# Patient Record
Sex: Female | Born: 1970 | Race: Black or African American | Hispanic: No | Smoking: Former smoker
Health system: Southern US, Community
[De-identification: ages and names within clinical notes are randomized; demographics above are authoritative.]

## PROBLEM LIST (undated history)

## (undated) DIAGNOSIS — N189 Chronic kidney disease, unspecified: Secondary | ICD-10-CM

## (undated) DIAGNOSIS — M797 Fibromyalgia: Secondary | ICD-10-CM

## (undated) DIAGNOSIS — E611 Iron deficiency: Secondary | ICD-10-CM

## (undated) DIAGNOSIS — R7303 Prediabetes: Secondary | ICD-10-CM

## (undated) DIAGNOSIS — D649 Anemia, unspecified: Secondary | ICD-10-CM

## (undated) DIAGNOSIS — K219 Gastro-esophageal reflux disease without esophagitis: Secondary | ICD-10-CM

## (undated) DIAGNOSIS — J45909 Unspecified asthma, uncomplicated: Secondary | ICD-10-CM

## (undated) DIAGNOSIS — M719 Bursopathy, unspecified: Secondary | ICD-10-CM

## (undated) DIAGNOSIS — Z8249 Family history of ischemic heart disease and other diseases of the circulatory system: Secondary | ICD-10-CM

## (undated) DIAGNOSIS — R9439 Abnormal result of other cardiovascular function study: Secondary | ICD-10-CM

## (undated) DIAGNOSIS — F32A Depression, unspecified: Secondary | ICD-10-CM

## (undated) DIAGNOSIS — F329 Major depressive disorder, single episode, unspecified: Secondary | ICD-10-CM

## (undated) HISTORY — PX: KIDNEY SURGERY: SHX687

## (undated) HISTORY — PX: ESOPHAGOGASTRODUODENOSCOPY: SHX1529

## (undated) HISTORY — PX: TONSILLECTOMY: SUR1361

---

## 1995-05-31 HISTORY — PX: TUBAL LIGATION: SHX77

## 1998-11-19 ENCOUNTER — Emergency Department (HOSPITAL_COMMUNITY): Admission: EM | Admit: 1998-11-19 | Discharge: 1998-11-19 | Payer: Self-pay | Admitting: Emergency Medicine

## 1999-01-09 ENCOUNTER — Emergency Department (HOSPITAL_COMMUNITY): Admission: EM | Admit: 1999-01-09 | Discharge: 1999-01-09 | Payer: Self-pay | Admitting: *Deleted

## 1999-03-17 ENCOUNTER — Encounter: Payer: Self-pay | Admitting: Emergency Medicine

## 1999-03-17 ENCOUNTER — Emergency Department (HOSPITAL_COMMUNITY): Admission: EM | Admit: 1999-03-17 | Discharge: 1999-03-17 | Payer: Self-pay | Admitting: Emergency Medicine

## 1999-04-19 ENCOUNTER — Emergency Department (HOSPITAL_COMMUNITY): Admission: EM | Admit: 1999-04-19 | Discharge: 1999-04-19 | Payer: Self-pay | Admitting: Emergency Medicine

## 2000-12-13 ENCOUNTER — Emergency Department (HOSPITAL_COMMUNITY): Admission: EM | Admit: 2000-12-13 | Discharge: 2000-12-13 | Payer: Self-pay | Admitting: Emergency Medicine

## 2003-01-07 ENCOUNTER — Ambulatory Visit (HOSPITAL_COMMUNITY): Admission: RE | Admit: 2003-01-07 | Discharge: 2003-01-07 | Payer: Self-pay | Admitting: Urology

## 2003-01-07 ENCOUNTER — Encounter: Payer: Self-pay | Admitting: Urology

## 2004-05-30 HISTORY — PX: ABDOMINAL HYSTERECTOMY: SHX81

## 2015-04-09 ENCOUNTER — Emergency Department (HOSPITAL_COMMUNITY): Admission: EM | Admit: 2015-04-09 | Payer: Self-pay | Source: Home / Self Care

## 2015-04-10 ENCOUNTER — Encounter (HOSPITAL_COMMUNITY): Payer: Self-pay | Admitting: *Deleted

## 2015-04-10 ENCOUNTER — Encounter (HOSPITAL_COMMUNITY)
Admission: AD | Disposition: A | Discharge status: Home / Self Care | Payer: Self-pay | Source: Other Acute Inpatient Hospital | Attending: Cardiology

## 2015-04-10 ENCOUNTER — Observation Stay (HOSPITAL_COMMUNITY)
Admission: AD | Admit: 2015-04-10 | Discharge: 2015-04-11 | Disposition: A | Discharge status: Home / Self Care | Payer: Self-pay | Source: Other Acute Inpatient Hospital | Attending: Cardiology | Admitting: Cardiology

## 2015-04-10 ENCOUNTER — Observation Stay (HOSPITAL_BASED_OUTPATIENT_CLINIC_OR_DEPARTMENT_OTHER): Payer: Self-pay

## 2015-04-10 DIAGNOSIS — K219 Gastro-esophageal reflux disease without esophagitis: Secondary | ICD-10-CM | POA: Diagnosis present

## 2015-04-10 DIAGNOSIS — E669 Obesity, unspecified: Secondary | ICD-10-CM | POA: Insufficient documentation

## 2015-04-10 DIAGNOSIS — Z6838 Body mass index (BMI) 38.0-38.9, adult: Secondary | ICD-10-CM | POA: Insufficient documentation

## 2015-04-10 DIAGNOSIS — M797 Fibromyalgia: Secondary | ICD-10-CM | POA: Insufficient documentation

## 2015-04-10 DIAGNOSIS — Z8249 Family history of ischemic heart disease and other diseases of the circulatory system: Secondary | ICD-10-CM

## 2015-04-10 DIAGNOSIS — R071 Chest pain on breathing: Secondary | ICD-10-CM

## 2015-04-10 DIAGNOSIS — Z87891 Personal history of nicotine dependence: Secondary | ICD-10-CM | POA: Insufficient documentation

## 2015-04-10 DIAGNOSIS — IMO0001 Reserved for inherently not codable concepts without codable children: Secondary | ICD-10-CM

## 2015-04-10 DIAGNOSIS — R9439 Abnormal result of other cardiovascular function study: Secondary | ICD-10-CM | POA: Diagnosis present

## 2015-04-10 DIAGNOSIS — R079 Chest pain, unspecified: Secondary | ICD-10-CM | POA: Diagnosis present

## 2015-04-10 DIAGNOSIS — I209 Angina pectoris, unspecified: Secondary | ICD-10-CM

## 2015-04-10 DIAGNOSIS — Z0389 Encounter for observation for other suspected diseases and conditions ruled out: Secondary | ICD-10-CM

## 2015-04-10 DIAGNOSIS — R0789 Other chest pain: Principal | ICD-10-CM | POA: Insufficient documentation

## 2015-04-10 DIAGNOSIS — F329 Major depressive disorder, single episode, unspecified: Secondary | ICD-10-CM | POA: Insufficient documentation

## 2015-04-10 DIAGNOSIS — J45909 Unspecified asthma, uncomplicated: Secondary | ICD-10-CM | POA: Diagnosis present

## 2015-04-10 HISTORY — DX: Depression, unspecified: F32.A

## 2015-04-10 HISTORY — DX: Chronic kidney disease, unspecified: N18.9

## 2015-04-10 HISTORY — DX: Gastro-esophageal reflux disease without esophagitis: K21.9

## 2015-04-10 HISTORY — DX: Unspecified asthma, uncomplicated: J45.909

## 2015-04-10 HISTORY — DX: Family history of ischemic heart disease and other diseases of the circulatory system: Z82.49

## 2015-04-10 HISTORY — DX: Abnormal result of other cardiovascular function study: R94.39

## 2015-04-10 HISTORY — DX: Fibromyalgia: M79.7

## 2015-04-10 HISTORY — DX: Major depressive disorder, single episode, unspecified: F32.9

## 2015-04-10 HISTORY — PX: CARDIAC CATHETERIZATION: SHX172

## 2015-04-10 LAB — LIPID PANEL
CHOL/HDL RATIO: 3.8 ratio
CHOLESTEROL: 173 mg/dL (ref 0–200)
HDL: 45 mg/dL (ref >40)
LDL Cholesterol: 120 mg/dL — ABNORMAL HIGH (ref 0–99)
TRIGLYCERIDES: 39 mg/dL (ref <150)
VLDL: 8 mg/dL (ref 0–40)

## 2015-04-10 LAB — COMPREHENSIVE METABOLIC PANEL
ALBUMIN: 3.1 g/dL — AB (ref 3.5–5.0)
ALK PHOS: 69 U/L (ref 38–126)
ALT: 13 U/L — ABNORMAL LOW (ref 14–54)
ANION GAP: 9 (ref 5–15)
AST: 13 U/L — ABNORMAL LOW (ref 15–41)
BUN: 11 mg/dL (ref 6–20)
CHLORIDE: 107 mmol/L (ref 101–111)
CO2: 23 mmol/L (ref 22–32)
Calcium: 8.6 mg/dL — ABNORMAL LOW (ref 8.9–10.3)
Creatinine, Ser: 1.06 mg/dL — ABNORMAL HIGH (ref 0.44–1.00)
GFR calc non Af Amer: >60 mL/min (ref >60)
GLUCOSE: 82 mg/dL (ref 65–99)
POTASSIUM: 3.5 mmol/L (ref 3.5–5.1)
SODIUM: 139 mmol/L (ref 135–145)
Total Bilirubin: 0.6 mg/dL (ref 0.3–1.2)
Total Protein: 6.1 g/dL — ABNORMAL LOW (ref 6.5–8.1)

## 2015-04-10 LAB — CBC
HCT: 34.1 % — ABNORMAL LOW (ref 36.0–46.0)
HEMOGLOBIN: 10.6 g/dL — AB (ref 12.0–15.0)
MCH: 22.6 pg — AB (ref 26.0–34.0)
MCHC: 31.1 g/dL (ref 30.0–36.0)
MCV: 72.6 fL — ABNORMAL LOW (ref 78.0–100.0)
PLATELETS: 195 10*3/uL (ref 150–400)
RBC: 4.7 MIL/uL (ref 3.87–5.11)
RDW: 15.5 % (ref 11.5–15.5)
WBC: 8.6 10*3/uL (ref 4.0–10.5)

## 2015-04-10 LAB — TROPONIN I
Troponin I: <0.03 ng/mL (ref <0.031)
Troponin I: <0.03 ng/mL (ref <0.031)

## 2015-04-10 LAB — SEDIMENTATION RATE: Sed Rate: 24 mm/hr — ABNORMAL HIGH (ref 0–22)

## 2015-04-10 LAB — PROTIME-INR
INR: 1.16 (ref 0.00–1.49)
Prothrombin Time: 15 seconds (ref 11.6–15.2)

## 2015-04-10 LAB — BRAIN NATRIURETIC PEPTIDE: B NATRIURETIC PEPTIDE 5: 15.5 pg/mL (ref 0.0–100.0)

## 2015-04-10 LAB — APTT: APTT: 28 s (ref 24–37)

## 2015-04-10 LAB — D-DIMER, QUANTITATIVE (NOT AT ARMC)

## 2015-04-10 LAB — C-REACTIVE PROTEIN

## 2015-04-10 LAB — TSH: TSH: 4.047 u[IU]/mL (ref 0.350–4.500)

## 2015-04-10 SURGERY — LEFT HEART CATH AND CORONARY ANGIOGRAPHY
Anesthesia: LOCAL

## 2015-04-10 MED ORDER — SODIUM CHLORIDE 0.9 % WEIGHT BASED INFUSION
3.0000 mL/kg/h | INTRAVENOUS | Status: DC
  Administered 2015-04-10: 3 mL/kg/h via INTRAVENOUS

## 2015-04-10 MED ORDER — TEMAZEPAM 7.5 MG PO CAPS
7.5000 mg | ORAL_CAPSULE | Freq: Once | ORAL | Status: AC
  Administered 2015-04-11: 7.5 mg via ORAL
  Filled 2015-04-10: qty 1

## 2015-04-10 MED ORDER — ASPIRIN 81 MG PO CHEW: 81.0000 mg | CHEWABLE_TABLET | ORAL | Status: DC

## 2015-04-10 MED ORDER — ONDANSETRON HCL 4 MG/2ML IJ SOLN: 4.0000 mg | Freq: Four times a day (QID) | INTRAMUSCULAR | Status: DC | PRN

## 2015-04-10 MED ORDER — HEPARIN (PORCINE) IN NACL 2-0.9 UNIT/ML-% IJ SOLN
INTRAMUSCULAR | Status: AC
  Filled 2015-04-10: qty 1000

## 2015-04-10 MED ORDER — LIDOCAINE HCL (PF) 1 % IJ SOLN
INTRAMUSCULAR | Status: DC | PRN
  Administered 2015-04-10: 16:00:00

## 2015-04-10 MED ORDER — SODIUM CHLORIDE 0.9 % IJ SOLN: 3.0000 mL | Freq: Two times a day (BID) | INTRAMUSCULAR | Status: DC

## 2015-04-10 MED ORDER — HEPARIN SODIUM (PORCINE) 1000 UNIT/ML IJ SOLN
INTRAMUSCULAR | Status: DC | PRN
  Administered 2015-04-10: 5000 [IU] via INTRAVENOUS

## 2015-04-10 MED ORDER — IOHEXOL 350 MG/ML SOLN
INTRAVENOUS | Status: DC | PRN
  Administered 2015-04-10: 65 mL via INTRA_ARTERIAL

## 2015-04-10 MED ORDER — VERAPAMIL HCL 2.5 MG/ML IV SOLN
INTRAVENOUS | Status: DC | PRN
  Administered 2015-04-10: 15:00:00 via INTRA_ARTERIAL

## 2015-04-10 MED ORDER — MIDAZOLAM HCL 2 MG/2ML IJ SOLN
INTRAMUSCULAR | Status: DC | PRN
  Administered 2015-04-10 (×2): 1 mg via INTRAVENOUS

## 2015-04-10 MED ORDER — SODIUM CHLORIDE 0.9 % IJ SOLN: 3.0000 mL | INTRAMUSCULAR | Status: DC | PRN

## 2015-04-10 MED ORDER — ACETAMINOPHEN 325 MG PO TABS: 650.0000 mg | ORAL_TABLET | ORAL | Status: DC | PRN

## 2015-04-10 MED ORDER — SODIUM CHLORIDE 0.9 % IJ SOLN
3.0000 mL | Freq: Two times a day (BID) | INTRAMUSCULAR | Status: DC
  Administered 2015-04-11: 3 mL via INTRAVENOUS

## 2015-04-10 MED ORDER — SODIUM CHLORIDE 0.9 % WEIGHT BASED INFUSION
1.0000 mL/kg/h | INTRAVENOUS | Status: DC
  Administered 2015-04-10: 1 mL/kg/h via INTRAVENOUS

## 2015-04-10 MED ORDER — HEPARIN SODIUM (PORCINE) 1000 UNIT/ML IJ SOLN
INTRAMUSCULAR | Status: AC
  Filled 2015-04-10: qty 1

## 2015-04-10 MED ORDER — MIDAZOLAM HCL 2 MG/2ML IJ SOLN
INTRAMUSCULAR | Status: AC
  Filled 2015-04-10: qty 4

## 2015-04-10 MED ORDER — GI COCKTAIL ~~LOC~~: 30.0000 mL | Freq: Four times a day (QID) | ORAL | Status: DC | PRN

## 2015-04-10 MED ORDER — SODIUM CHLORIDE 0.9 % WEIGHT BASED INFUSION
3.0000 mL/kg/h | INTRAVENOUS | Status: AC
  Administered 2015-04-10: 3 mL/kg/h via INTRAVENOUS

## 2015-04-10 MED ORDER — PANTOPRAZOLE SODIUM 40 MG PO TBEC
40.0000 mg | DELAYED_RELEASE_TABLET | Freq: Every day | ORAL | Status: DC
  Administered 2015-04-10 – 2015-04-11 (×2): 40 mg via ORAL
  Filled 2015-04-10 (×2): qty 1

## 2015-04-10 MED ORDER — LIDOCAINE HCL (PF) 1 % IJ SOLN
INTRAMUSCULAR | Status: AC
  Filled 2015-04-10: qty 30

## 2015-04-10 MED ORDER — SODIUM CHLORIDE 0.9 % WEIGHT BASED INFUSION: 3.0000 mL/kg/h | INTRAVENOUS | Status: DC

## 2015-04-10 MED ORDER — FENTANYL CITRATE (PF) 100 MCG/2ML IJ SOLN
INTRAMUSCULAR | Status: AC
  Filled 2015-04-10: qty 4

## 2015-04-10 MED ORDER — FENTANYL CITRATE (PF) 100 MCG/2ML IJ SOLN
INTRAMUSCULAR | Status: DC | PRN
  Administered 2015-04-10: 50 ug via INTRAVENOUS

## 2015-04-10 MED ORDER — HEPARIN SODIUM (PORCINE) 5000 UNIT/ML IJ SOLN
5000.0000 [IU] | Freq: Three times a day (TID) | INTRAMUSCULAR | Status: DC
  Administered 2015-04-10 (×2): 5000 [IU] via SUBCUTANEOUS
  Filled 2015-04-10 (×2): qty 1

## 2015-04-10 MED ORDER — SODIUM CHLORIDE 0.9 % IV SOLN: 250.0000 mL | INTRAVENOUS | Status: DC | PRN

## 2015-04-10 MED ORDER — ASPIRIN EC 81 MG PO TBEC
81.0000 mg | DELAYED_RELEASE_TABLET | Freq: Every day | ORAL | Status: DC
  Administered 2015-04-10 – 2015-04-11 (×2): 81 mg via ORAL
  Filled 2015-04-10 (×2): qty 1

## 2015-04-10 MED ORDER — MORPHINE SULFATE (PF) 2 MG/ML IV SOLN
2.0000 mg | INTRAVENOUS | Status: DC | PRN
  Administered 2015-04-10 (×2): 2 mg via INTRAVENOUS
  Filled 2015-04-10 (×2): qty 1

## 2015-04-10 MED ORDER — NITROGLYCERIN 1 MG/10 ML FOR IR/CATH LAB
INTRA_ARTERIAL | Status: AC
  Filled 2015-04-10: qty 10

## 2015-04-10 MED ORDER — SODIUM CHLORIDE 0.9 % WEIGHT BASED INFUSION: 1.0000 mL/kg/h | INTRAVENOUS | Status: DC

## 2015-04-10 SURGICAL SUPPLY — 9 items
CATH INFINITI 5 FR JL3.5 (CATHETERS) ×2 IMPLANT
CATH INFINITI JR4 5F (CATHETERS) ×2 IMPLANT
DEVICE RAD COMP TR BAND LRG (VASCULAR PRODUCTS) ×2 IMPLANT
GLIDESHEATH SLEND A-KIT 6F 22G (SHEATH) ×2 IMPLANT
KIT HEART LEFT (KITS) ×2 IMPLANT
PACK CARDIAC CATHETERIZATION (CUSTOM PROCEDURE TRAY) ×2 IMPLANT
TRANSDUCER W/STOPCOCK (MISCELLANEOUS) ×2 IMPLANT
TUBING CIL FLEX 10 FLL-RA (TUBING) ×2 IMPLANT
WIRE SAFE-T 1.5MM-J .035X260CM (WIRE) ×2 IMPLANT

## 2015-04-10 NOTE — Progress Notes (Signed)
Subjective: Woke at 0500 from sleep with midsternal chest pain, heavy pressure, some brief sharp pain but mostly pressure.  No associated symptoms.  Objective: Vital signs in last 24 hours: Temp:  [97.8 F (36.6 C)-98 F (36.7 C)] 97.8 F (36.6 C) (11/11 0532) Pulse Rate:  [61-64] 64 (11/11 0532) Resp:  [18] 18 (11/11 0056) BP: (103-133)/(70-77) 103/70 mmHg (11/11 0532) SpO2:  [100 %] 100 % (11/11 0532) Weight:  [237 lb 3.2 oz (107.593 kg)] 237 lb 3.2 oz (107.593 kg) (11/11 0056) Weight change:  Last BM Date: 04/09/15 Intake/Output from previous day: 11/10 0701 - 11/11 0700 In: 240 [P.O.:240] Out: 200 [Urine:200] Intake/Output this shift:    PE: General:Pleasant affect, NAD Skin:Warm and dry, brisk capillary refill HEENT:normocephalic, sclera clear, mucus membranes moist Neck:supple, no JVD, no bruits  Heart:S1S2 RRR without murmur, gallup, rub or click Lungs:clear without rales, rhonchi, or wheezes ZOX:WRUE, non tender, + BS, do not palpate liver spleen or masses Ext:no lower ext edema, 2+ pedal pulses, 2+ radial pulses Neuro:alert and oriented X 3, MAE, follows commands, + facial symmetry Tele:  SR   EKG with poor R wave progression in precordial leads.   Lab Results:  Recent Labs  04/10/15 0320  WBC 8.6  HGB 10.6*  HCT 34.1*  PLT 195   BMET  Recent Labs  04/10/15 0320  NA 139  K 3.5  CL 107  CO2 23  GLUCOSE 82  BUN 11  CREATININE 1.06*  CALCIUM 8.6*    Recent Labs  04/10/15 0320  TROPONINI <0.03  <0.03    Lab Results  Component Value Date   CHOL 173 04/10/2015   HDL 45 04/10/2015   LDLCALC 120* 04/10/2015   TRIG 39 04/10/2015   CHOLHDL 3.8 04/10/2015   No results found for: HGBA1C   Lab Results  Component Value Date   TSH 4.047 04/10/2015    Hepatic Function Panel  Recent Labs  04/10/15 0320  PROT 6.1*  ALBUMIN 3.1*  AST 13*  ALT 13*  ALKPHOS 69  BILITOT 0.6    Recent Labs  04/10/15 0320  CHOL 173    No results for input(s): PROTIME in the last 72 hours.     Studies/Results: No results found.  Medications: I have reviewed the patient's current medications. Scheduled Meds: . aspirin EC  81 mg Oral Daily  . heparin  5,000 Units Subcutaneous 3 times per day  . pantoprazole  40 mg Oral Daily   Continuous Infusions:  PRN Meds:.acetaminophen, gi cocktail, morphine injection, ondansetron (ZOFRAN) IV  Assessment/Plan:  62F with fibromyalgia, asthma, history of ureteral obstruction, GERD, depression, and recent positive stress test who presents with atypical CP. + FH CAD.   Principal Problem:   Chest pain continued angian - neg. MI, with + stress test, is NPO- will discuss with Dr. Erlene Quan and cardiac cath today.  If negative look for other causes of chest pain.    Active Problems:   Abnormal stress test- nuc stress test in [place] with perfusion defects in the mid to distal LAD region (but no associated WMA). Her risk factors for CAD include family history, prior smoking, and obesity--was to see Dr. Wyline Mood 04/15/15.   FH: CAD (coronary artery disease) premature in her mother   Fibromyalgia   Asthma   GERD (gastroesophageal reflux disease)   GYN +hysterectomy   The patient understands that risks included but are not limited to stroke (1 in 1000), death (1  in 1000), kidney failure [usually temporary] (1 in 500), bleeding (1 in 200), allergic reaction [possibly serious] (1 in 200).      LOS: 0 days   Time spent with pt. :15 minutes. [facility] SNGOLD,LAURA R  Nurse Practitioner Certified Pager [phone] or after 5pm and on weekends call [phone] 04/10/2015, 7:59 AM   Agree with note written by Nada BoozerLaura Ingold RNP  Admitted with CP that awakened her from sleep. + CRF. Recent + MV. Enz neg. Exam benign. For cor angio today.  Nanetta BattyBerry, Jonathan 04/10/2015 9:19 AM

## 2015-04-10 NOTE — Interval H&P Note (Signed)
Cath Lab Visit (complete for each Cath Lab visit)  Clinical Evaluation Leading to the Procedure:   ACS: Yes.    Non-ACS:    Anginal Classification: CCS III  Anti-ischemic medical therapy: Minimal Therapy (1 class of medications)  Non-Invasive Test Results: Intermediate-risk stress test findings: cardiac mortality 1-3%/year  Prior CABG: No previous CABG      History and Physical Interval Note:  04/10/2015 2:37 PM  Yvonne Cobb  has presented today for surgery, with the diagnosis of positive enzymes  The various methods of treatment have been discussed with the patient and family. After consideration of risks, benefits and other options for treatment, the patient has consented to  Procedure(s): Left Heart Cath and Coronary Angiography (N/A) as a surgical intervention .  The patient's history has been reviewed, patient examined, no change in status, stable for surgery.  I have reviewed the patient's chart and labs.  Questions were answered to the patient's satisfaction.     Lesleigh NoeSMITH III,HENRY W

## 2015-04-10 NOTE — Progress Notes (Signed)
  Echocardiogram 2D Echocardiogram has been performed.  Delcie RochENNINGTON, Kimberly Nieland 04/10/2015, 5:46 PM

## 2015-04-10 NOTE — H&P (Signed)
Patient ID: Yvonne Cobb MRN: [id number], DOB/AGE: 01/21/1971   Admit date: 04/10/2015   Primary Physician: Monico Blitz, MD Primary Cardiologist: Carlyle Dolly, MD (1st appointment 04/15/15)  Pt. Profile:  44F with fibromyalgia, asthma, history of ureteral obstruction, GERD, depression, and recent positive stress test who presents with atypical CP.   Problem List  Past Medical History  Diagnosis Date  . Chronic kidney disease     per patient, 75% right kidney function, 25% left kidney function  . Depression   . Asthma     Patient states "asthmatic bronchitis"    Past Surgical History  Procedure Laterality Date  . Tonsillectomy    . Cesarean section    . Esophagogastroduodenoscopy    . Abdominal hysterectomy  2006    partial; left tube and ovary removal  . Tubal ligation  1997  . Kidney surgery  1990-2006    Patient states that she has had at least 10 surgeries on her left kidney     Allergies  Allergies  Allergen Reactions  . Hydrocodone Itching    Hives/itching  . Tape Itching    "paper tape"    HPI  44F with fibromyalgia, asthma, history of ureteral obstruction, GERD, depression, and recent positive stress test who presents with atypical CP.   [name] reports that about 2 weeks ago she developed chest discomfort - central heaviness that has been constant and a sharp left sided pain that is intermittent. SH has associated LH, dizziness, some SOB. She had some chills last week. Pain is worse with inspiration, improved with being seated upright, worse with ambulation and palpation.    She was seen at [facility] and admitted for observation and on 04/02/15, she underwent a exercise nuclear stress test. She exercised for 6:57 on a standard Bruce Protocol and reached 68% MPHR and 10.1 METS. Perfusion imaging demonstrated a moderate sized reversible defect in the mid anterior, mid anteroseptal, and apical anterior segment. There was no stress induced WMA and  the post stress EF was 51%. She was discharged to follow-up as a new patient with Dr. Harl Bowie on 11/16. Due to persistent symptoms (the central heaviness has been constant for 2 weeks) she represented to [facility].  On arrival to [facility], she was hemodynamically stable, saturating 100% on RA. ECG demonstrated NSR, cristae pattern.  Labs were notable for  Cr 0.99, K 4.3, TnT <0.01. CXR demonstrated no active cardiopulmonary disease. She was given  ASA, nitropaste, and morphine and transferred to [facility]. On arrival to [facility], she continued to have CP, 5/10 in severity.   She has a mother with multiple MIs, HTN, and DM. Her father had DM and died of lung CA. She has 8 siblings, two of which have CAD, although she is not sure how they got the diagnosis. She was a 1 cigar a day smoker for 5 years but has quit.   Home Medications  Prior to Admission medications   Not on File    Family History  Family History  Problem Relation Age of Onset  . CAD Mother   . Diabetes Mother   . Hypertension Mother   . Heart attack Mother   . Lung cancer Father   . Diabetes Father   . Kidney disease Father   . Diabetes Sister   . Diabetes Sister     Social History  Social History   Social History  . Marital Status: Divorced    Spouse Name: N/A  . Number of Children: N/A  .  Years of Education: N/A   Occupational History  . Not on file.   Social History Main Topics  . Smoking status: Former Smoker -- 5 years    Types: Cigars  . Smokeless tobacco: Not on file  . Alcohol Use: No  . Drug Use: No  . Sexual Activity: Not on file   Other Topics Concern  . Not on file   Social History Narrative  . No narrative on file     Review of Systems General:  +chills. No fever, night sweats or weight changes. No recent viral illnesses. Cardiovascular: See HPI Dermatological: No rash, lesions/masses Respiratory: No cough, dyspnea Urologic: No hematuria, dysuria Abdominal:   No nausea, vomiting, diarrhea,  bright red blood per rectum, melena, or hematemesis Neurologic:  No visual changes, wkns, changes in mental status. All other systems reviewed and are otherwise negative except as noted above.  Physical Exam  Blood pressure 133/77, pulse 61, temperature 98 F (36.7 C), temperature source Oral, resp. rate 18, height 5' 6"  (1.676 m), weight 107.593 kg (237 lb 3.2 oz), SpO2 100 %.  General: Pleasant, NAD Psych: Normal affect. Neuro: Alert and oriented X 3. Moves all extremities spontaneously. HEENT: Normal  Neck: Supple without bruits or JVD. Lungs:  Resp regular and unlabored, CTA. Heart: RRR no s3, s4, soft flow murmur. TTP of chest, most notably to left of the mid aspect of the sternum.  Abdomen: Soft, non-tender, non-distended, BS + x 4.  Extremities: No clubbing, cyanosis or edema. DP/PT/Radials 2+ and equal bilaterally.  Labs  Troponin (Point of Care Test) No results for input(s): TROPIPOC in the last 72 hours. No results for input(s): CKTOTAL, CKMB, TROPONINI in the last 72 hours. No results found for: WBC, HGB, HCT, MCV, PLT No results for input(s): NA, K, CL, CO2, BUN, CREATININE, CALCIUM, PROT, BILITOT, ALKPHOS, ALT, AST, GLUCOSE in the last 168 hours.  Invalid input(s): LABALBU No results found for: CHOL, HDL, LDLCALC, TRIG No results found for: DDIMER   Radiology/Studies  No results found.  ECG  2015/04/18 @ 18:58 (from [facility]): NSR, cristae pattern  ASSESSMENT AND PLAN  44F with fibromyalgia, asthma, history of ureteral obstruction, GERD, depression, and recent positive stress test who presents with CP. ECG is benign and troponin is negative. The CP is atypical in many ways, including the positional nature, worsening with inspiration, and exacerbation with palpation. However, she has a positive stress test with perfusion defects in the mid to distal LAD region (but no associated WMA). Her risk factors for CAD include family history, prior smoking, and obesity.  However, she has had constant CP for 2 weeks and if this were indeed ACS, one would have expected to have seen positive troponins. Causes for this CP could include MSK, PE, pericarditis, aortic dissection, ACS, GERD. The symptoms are relatively new, very bothersome, and merit further work-up.  cycle troponins d-dimer to r/o PE and dissection ESR/CRP to assess for systemic inflammation which might suggest pericarditis CBC, CMP, INR/PTT Lipids, A1c ASA If no alternative etiology is found for CP, will need to consider cardiac cath despite atypical symptoms, due to recent positive stress test  Signed, Lamar Sprinkles, MD 04/10/2015, 1:37 AM

## 2015-04-10 NOTE — H&P (View-Only) (Signed)
       Subjective: Woke at 0500 from sleep with midsternal chest pain, heavy pressure, some brief sharp pain but mostly pressure.  No associated symptoms.  Objective: Vital signs in last 24 hours: Temp:  [97.8 F (36.6 C)-98 F (36.7 C)] 97.8 F (36.6 C) (11/11 0532) Pulse Rate:  [61-64] 64 (11/11 0532) Resp:  [18] 18 (11/11 0056) BP: (103-133)/(70-77) 103/70 mmHg (11/11 0532) SpO2:  [100 %] 100 % (11/11 0532) Weight:  [237 lb 3.2 oz (107.593 kg)] 237 lb 3.2 oz (107.593 kg) (11/11 0056) Weight change:  Last BM Date: 04/09/15 Intake/Output from previous day: 11/10 0701 - 11/11 0700 In: 240 [P.O.:240] Out: 200 [Urine:200] Intake/Output this shift:    PE: General:Pleasant affect, NAD Skin:Warm and dry, brisk capillary refill HEENT:normocephalic, sclera clear, mucus membranes moist Neck:supple, no JVD, no bruits  Heart:S1S2 RRR without murmur, gallup, rub or click Lungs:clear without rales, rhonchi, or wheezes Abd:soft, non tender, + BS, do not palpate liver spleen or masses Ext:no lower ext edema, 2+ pedal pulses, 2+ radial pulses Neuro:alert and oriented X 3, MAE, follows commands, + facial symmetry Tele:  SR   EKG with poor R wave progression in precordial leads.   Lab Results:  Recent Labs  04/10/15 0320  WBC 8.6  HGB 10.6*  HCT 34.1*  PLT 195   BMET  Recent Labs  04/10/15 0320  NA 139  K 3.5  CL 107  CO2 23  GLUCOSE 82  BUN 11  CREATININE 1.06*  CALCIUM 8.6*    Recent Labs  04/10/15 0320  TROPONINI <0.03  <0.03    Lab Results  Component Value Date   CHOL 173 04/10/2015   HDL 45 04/10/2015   LDLCALC 120* 04/10/2015   TRIG 39 04/10/2015   CHOLHDL 3.8 04/10/2015   No results found for: HGBA1C   Lab Results  Component Value Date   TSH 4.047 04/10/2015    Hepatic Function Panel  Recent Labs  04/10/15 0320  PROT 6.1*  ALBUMIN 3.1*  AST 13*  ALT 13*  ALKPHOS 69  BILITOT 0.6    Recent Labs  04/10/15 0320  CHOL 173    No results for input(s): PROTIME in the last 72 hours.     Studies/Results: No results found.  Medications: I have reviewed the patient's current medications. Scheduled Meds: . aspirin EC  81 mg Oral Daily  . heparin  5,000 Units Subcutaneous 3 times per day  . pantoprazole  40 mg Oral Daily   Continuous Infusions:  PRN Meds:.acetaminophen, gi cocktail, morphine injection, ondansetron (ZOFRAN) IV  Assessment/Plan:  44F with fibromyalgia, asthma, history of ureteral obstruction, GERD, depression, and recent positive stress test who presents with atypical CP. + FH CAD.   Principal Problem:   Chest pain continued angian - neg. MI, with + stress test, is NPO- will discuss with Dr. J. Berry and cardiac cath today.  If negative look for other causes of chest pain.    Active Problems:   Abnormal stress test- nuc stress test in [place] with perfusion defects in the mid to distal LAD region (but no associated WMA). Her risk factors for CAD include family history, prior smoking, and obesity--was to see Dr. Branch 04/15/15.   FH: CAD (coronary artery disease) premature in her mother   Fibromyalgia   Asthma   GERD (gastroesophageal reflux disease)   GYN +hysterectomy   The patient understands that risks included but are not limited to stroke (1 in 1000), death (1   in 1000), kidney failure [usually temporary] (1 in 500), bleeding (1 in 200), allergic reaction [possibly serious] (1 in 200).      LOS: 0 days   Time spent with pt. :15 minutes. [facility] SNGOLD,LAURA R  Nurse Practitioner Certified Pager [phone] or after 5pm and on weekends call [phone] 04/10/2015, 7:59 AM   Agree with note written by Nada BoozerLaura Ingold RNP  Admitted with CP that awakened her from sleep. + CRF. Recent + MV. Enz neg. Exam benign. For cor angio today.  Nanetta BattyBerry, Jonathan 04/10/2015 9:19 AM

## 2015-04-11 DIAGNOSIS — R0789 Other chest pain: Principal | ICD-10-CM

## 2015-04-11 DIAGNOSIS — IMO0001 Reserved for inherently not codable concepts without codable children: Secondary | ICD-10-CM

## 2015-04-11 DIAGNOSIS — Z0389 Encounter for observation for other suspected diseases and conditions ruled out: Secondary | ICD-10-CM

## 2015-04-11 LAB — HEMOGLOBIN A1C
Hgb A1c MFr Bld: 5.7 % — ABNORMAL HIGH (ref 4.8–5.6)
MEAN PLASMA GLUCOSE: 117 mg/dL

## 2015-04-11 MED ORDER — ACETAMINOPHEN 325 MG PO TABS: 650.0000 mg | ORAL_TABLET | ORAL | Status: DC | PRN

## 2015-04-11 NOTE — Discharge Summary (Signed)
     Patient ID: Yvonne Cobb,  MRN: [id number], DOB/AGE: 06-18-1970 44 y.o.  Admit date: 04/10/2015 Discharge date: 04/11/2015  Primary Care Provider: Kirstie PeriSHAH,ASHISH, MD Primary Cardiologist: Dr Wyline MoodBranch  Discharge Diagnoses Principal Problem:   Chest pain with moderate risk of acute coronary syndrome Active Problems:   Abnormal stress test   Normal coronary arteries   FH: CAD (coronary artery disease)   Fibromyalgia   Asthma   GERD (gastroesophageal reflux disease)    Procedures: Coronary angiogram 04/10/15   Hospital Course:  44 y/o F with fibromyalgia, asthma, history of ureteral obstruction, GERD, depression, and recent positive stress test who presents with atypical CP. + FH CAD. She had [place]uc stress [place] in [place] that indicated perfusion defects in the mid to distal LAD region (but no associated WMA). Her risk factors for CAD include family history, prior smoking, and obesity--was to see Dr. Wyline MoodBranch 04/15/15 but [facility] on [facility]/09/15 with chest pain. She was [facility]. Her Troponin were negative x 3. She underwent diagnostic coronary angiogram 04/10/15 showed normal coronaries and LVF. She was seen by Dr Graciela HusbandsKlein the morning of 04/11/15 and felt to be stable for discharge. No further cardiac follow up indicated at this time.   Discharge Vitals:  Blood pressure 106/66, pulse 59, temperature 97.9 F (36.6 C), temperature source Oral, resp. rate 16, height 5\' 6"  (1.676 m), weight 237 lb 9.6 oz (107.775 kg), SpO2 100 %.    Labs: No results found for this or any previous visit (from the past 24 hour(s)).  Disposition:      Follow-up Information    Follow up with Dina RichBranch, Jonathan, MD.   Specialty:  Cardiology   Why:  As needed   Contact information:   [address] [place] [address] [phone]       Discharge Medications:    Medication List    TAKE these medications        acetaminophen 325 MG  tablet  Commonly known as:  TYLENOL  Take 2 tablets (650 mg total) by mouth every 4 (four) hours as needed for headache or mild pain.     esomeprazole 20 MG capsule  Commonly known as:  NEXIUM  Take 20 mg by mouth daily at 12 noon.         Duration of Discharge Encounter: Greater than 30 minutes including physician time.  Jolene ProvostSigned, Myan Locatelli PA-C 04/11/2015 12:02 PM

## 2015-04-11 NOTE — Progress Notes (Signed)
Patient Name: Yvonne Cobb      SUBJECTIVE: 44 year old lady with fibromyalgia and a positive stress test admitted with atypical chest pain 11/11. She underwent catheterization and normal coronary arteries.   Past Medical History  Diagnosis Date  . Chronic kidney disease     per patient, 75% right kidney function, 25% left kidney function  . Depression   . Asthma     Patient states "asthmatic bronchitis"  . Fibromyalgia   . GERD (gastroesophageal reflux disease)   . FH: CAD (coronary artery disease) 04/10/2015  . Abnormal stress test 04/10/2015    Scheduled Meds:  Scheduled Meds: . aspirin EC  81 mg Oral Daily  . pantoprazole  40 mg Oral Daily  . sodium chloride  3 mL Intravenous Q12H   Continuous Infusions:  sodium chloride, acetaminophen, gi cocktail, morphine injection, ondansetron (ZOFRAN) IV, sodium chloride    PHYSICAL EXAM Filed Vitals:   04/10/15 1544 04/10/15 2035 04/10/15 2310 04/11/15 0541  BP: 115/78 107/68 111/62 106/66  Pulse: 80 70 77 59  Temp:  98.3 F (36.8 C)  97.9 F (36.6 C)  TempSrc:  Oral  Oral  Resp: 11 16 16 16   Height:      Weight:    237 lb 9.6 oz (107.775 kg)  SpO2: 0% 100% 100% 100%   Well developed and nourished in no acute distress HENT normal Neck supple with JVP-flat Clear Regular rate and rhythm, no murmurs or gallops Abd-soft with active BS No Clubbing cyanosis edema Skin-warm and dry A & Oriented  Grossly normal sensory and motor function   TELEMETRY: Reviewed telemetry pt in nsr    Intake/Output Summary (Last 24 hours) at 04/11/15 1028 Last data filed at 04/11/15 0744  Gross per 24 hour  Intake    120 ml  Output   1350 ml  Net  -1230 ml    LABS: Basic Metabolic Panel:  Recent Labs Lab 04/10/15 0320  NA 139  K 3.5  CL 107  CO2 23  GLUCOSE 82  BUN 11  CREATININE 1.06*  CALCIUM 8.6*   Cardiac Enzymes:  Recent Labs  04/10/15 0320 04/10/15 0800  TROPONINI <0.03  <0.03 <0.03    CBC:  Recent Labs Lab 04/10/15 0320  WBC 8.6  HGB 10.6*  HCT 34.1*  MCV 72.6*  PLT 195   PROTIME:  Recent Labs  04/10/15 0320  LABPROT 15.0  INR 1.16   Liver Function Tests:  Recent Labs  04/10/15 0320  AST 13*  ALT 13*  ALKPHOS 69  BILITOT 0.6  PROT 6.1*  ALBUMIN 3.1*   No results for input(s): LIPASE, AMYLASE in the last 72 hours. BNP: BNP (last 3 results)  Recent Labs  04/10/15 0320  BNP 15.5    ProBNP (last 3 results) No results for input(s): PROBNP in the last 8760 hours.  D-Dimer:  Recent Labs  04/10/15 0320  DDIMER <0.27   Hemoglobin A1C:  Recent Labs  04/10/15 0320  HGBA1C 5.7*   Fasting Lipid Panel:  Recent Labs  04/10/15 0320  CHOL 173  HDL 45  LDLCALC 120*  TRIG 39  CHOLHDL 3.8   Thyroid Function Tests:  Recent Labs  04/10/15 0320  TSH 4.047   Anemia Panel:     ASSESSMENT AND PLAN:  Principal Problem:   Chest pain Active Problems:   FH: CAD (coronary artery disease)   Fibromyalgia   Asthma   GERD (gastroesophageal reflux disease)   Abnormal  stress test  We'll discontinue aspirin and discharged on PPI therapy No cardiology follow-up necessary  Signed, Sherryl Manges MD  04/11/2015

## 2015-04-11 NOTE — Discharge Instructions (Signed)
Nonspecific Chest Pain  °Chest pain can be caused by many different conditions. There is always a chance that your pain could be related to something serious, such as a heart attack or a blood clot in your lungs. Chest pain can also be caused by conditions that are not life-threatening. If you have chest pain, it is very important to follow up with your health care provider. °CAUSES  °Chest pain can be caused by: °· Heartburn. °· Pneumonia or bronchitis. °· Anxiety or stress. °· Inflammation around your heart (pericarditis) or lung (pleuritis or pleurisy). °· A blood clot in your lung. °· A collapsed lung (pneumothorax). It can develop suddenly on its own (spontaneous pneumothorax) or from trauma to the chest. °· Shingles infection (varicella-zoster virus). °· Heart attack. °· Damage to the bones, muscles, and cartilage that make up your chest wall. This can include: °¨ Bruised bones due to injury. °¨ Strained muscles or cartilage due to frequent or repeated coughing or overwork. °¨ Fracture to one or more ribs. °¨ Sore cartilage due to inflammation (costochondritis). °RISK FACTORS  °Risk factors for chest pain may include: °· Activities that increase your risk for trauma or injury to your chest. °· Respiratory infections or conditions that cause frequent coughing. °· Medical conditions or overeating that can cause heartburn. °· Heart disease or family history of heart disease. °· Conditions or health behaviors that increase your risk of developing a blood clot. °· Having had chicken pox (varicella zoster). °SIGNS AND SYMPTOMS °Chest pain can feel like: °· Burning or tingling on the surface of your chest or deep in your chest. °· Crushing, pressure, aching, or squeezing pain. °· Dull or sharp pain that is worse when you move, cough, or take a deep breath. °· Pain that is also felt in your back, neck, shoulder, or arm, or pain that spreads to any of these areas. °Your chest pain may come and go, or it may stay  constant. °DIAGNOSIS °Lab tests or other studies may be needed to find the cause of your pain. Your health care provider may have you take a test called an ambulatory ECG (electrocardiogram). An ECG records your heartbeat patterns at the time the test is performed. You may also have other tests, such as: °· Transthoracic echocardiogram (TTE). During echocardiography, sound waves are used to create a picture of all of the heart structures and to look at how blood flows through your heart. °· Transesophageal echocardiogram (TEE). This is a more advanced imaging test that obtains images from inside your body. It allows your health care provider to see your heart in finer detail. °· Cardiac monitoring. This allows your health care provider to monitor your heart rate and rhythm in real time. °· Holter monitor. This is a portable device that records your heartbeat and can help to diagnose abnormal heartbeats. It allows your health care provider to track your heart activity for several days, if needed. °· Stress tests. These can be done through exercise or by taking medicine that makes your heart beat more quickly. °· Blood tests. °· Imaging tests. °TREATMENT  °Your treatment depends on what is causing your chest pain. Treatment may include: °· Medicines. These may include: °¨ Acid blockers for heartburn. °¨ Anti-inflammatory medicine. °¨ Pain medicine for inflammatory conditions. °¨ Antibiotic medicine, if an infection is present. °¨ Medicines to dissolve blood clots. °¨ Medicines to treat coronary artery disease. °· Supportive care for conditions that do not require medicines. This may include: °¨ Resting. °¨ Applying heat   or cold packs to injured areas. °¨ Limiting activities until pain decreases. °HOME CARE INSTRUCTIONS °· If you were prescribed an antibiotic medicine, finish it all even if you start to feel better. °· Avoid any activities that bring on chest pain. °· Do not use any tobacco products, including  cigarettes, chewing tobacco, or electronic cigarettes. If you need help quitting, ask your health care provider. °· Do not drink alcohol. °· Take medicines only as directed by your health care provider. °· Keep all follow-up visits as directed by your health care provider. This is important. This includes any further testing if your chest pain does not go away. °· If heartburn is the cause for your chest pain, you may be told to keep your head raised (elevated) while sleeping. This reduces the chance that acid will go from your stomach into your esophagus. °· Make lifestyle changes as directed by your health care provider. These may include: °¨ Getting regular exercise. Ask your health care provider to suggest some activities that are safe for you. °¨ Eating a heart-healthy diet. A registered dietitian can help you to learn healthy eating options. °¨ Maintaining a healthy weight. °¨ Managing diabetes, if necessary. °¨ Reducing stress. °SEEK MEDICAL CARE IF: °· Your chest pain does not go away after treatment. °· You have a rash with blisters on your chest. °· You have a fever. °SEEK IMMEDIATE MEDICAL CARE IF:  °· Your chest pain is worse. °· You have an increasing cough, or you cough up blood. °· You have severe abdominal pain. °· You have severe weakness. °· You faint. °· You have chills. °· You have sudden, unexplained chest discomfort. °· You have sudden, unexplained discomfort in your arms, back, neck, or jaw. °· You have shortness of breath at any time. °· You suddenly start to sweat, or your skin gets clammy. °· You feel nauseous or you vomit. °· You suddenly feel light-headed or dizzy. °· Your heart begins to beat quickly, or it feels like it is skipping beats. °These symptoms may represent a serious problem that is an emergency. Do not wait to see if the symptoms will go away. Get medical help right away. Call your local emergency services (911 in the [place] Do not drive yourself to the hospital. °  °This  information is not intended to replace advice given to you by your health care provider. Make sure you discuss any questions you have with your health care provider. °  °Document Released: 02/23/2005 Document Revised: 06/06/2014 Document Reviewed: 12/20/2013 °Elsevier Interactive Patient Education ©2016 [employer] ° °Radial Site Care °Refer to this sheet in the next few weeks. These instructions provide you with information about caring for yourself after your procedure. Your health care provider may also give you more specific instructions. Your treatment has been planned according to current medical practices, but problems sometimes occur. Call your health care provider if you have any problems or questions after your procedure. °WHAT TO EXPECT AFTER THE PROCEDURE °After your procedure, it is typical to have the following: °· Bruising at the radial site that usually fades within 1-2 weeks. °· Blood collecting in the tissue (hematoma) that may be painful to the touch. It should usually decrease in size and tenderness within 1-2 weeks. °HOME CARE INSTRUCTIONS °· Take medicines only as directed by your health care provider. °· You may shower 24-48 hours after the procedure or as directed by your health care provider. Remove the bandage (dressing) and gently wash the site with   plain soap and water. Pat the area dry with a clean towel. Do not rub the site, because this may cause bleeding. °· Do not take baths, swim, or use a hot tub until your health care provider approves. °· Check your insertion site every day for redness, swelling, or drainage. °· Do not apply powder or lotion to the site. °· Do not flex or bend the affected arm for 24 hours or as directed by your health care provider. °· Do not push or pull heavy objects with the affected arm for 24 hours or as directed by your health care provider. °· Do not lift over 10 lb (4.5 kg) for 5 days after your procedure or as directed by your health care  provider. °· Ask your health care provider when it is okay to: °¨ Return to work or school. °¨ Resume usual physical activities or sports. °¨ Resume sexual activity. °· Do not drive home if you are discharged the same day as the procedure. Have someone else drive you. °· You may drive 24 hours after the procedure unless otherwise instructed by your health care provider. °· Do not operate machinery or power tools for 24 hours after the procedure. °· If your procedure was done as an outpatient procedure, which means that you went home the same day as your procedure, a responsible adult should be with you for the first 24 hours after you arrive home. °· Keep all follow-up visits as directed by your health care provider. This is important. °SEEK MEDICAL CARE IF: °· You have a fever. °· You have chills. °· You have increased bleeding from the radial site. Hold pressure on the site. °SEEK IMMEDIATE MEDICAL CARE IF: °· You have unusual pain at the radial site. °· You have redness, warmth, or swelling at the radial site. °· You have drainage (other than a small amount of blood on the dressing) from the radial site. °· The radial site is bleeding, and the bleeding does not stop after 30 minutes of holding steady pressure on the site. °· Your arm or hand becomes pale, cool, tingly, or numb. °  °This information is not intended to replace advice given to you by your health care provider. Make sure you discuss any questions you have with your health care provider. °  °Document Released: 06/18/2010 Document Revised: 06/06/2014 Document Reviewed: 12/02/2013 °Elsevier Interactive Patient Education ©2016 [employer] Inc[employer] ° ° °

## 2015-04-13 ENCOUNTER — Encounter (HOSPITAL_COMMUNITY): Payer: Self-pay | Admitting: Interventional Cardiology

## 2015-04-13 MED FILL — Nitroglycerin IV Soln 100 MCG/ML in D5W: INTRA_ARTERIAL | Qty: 10 | Status: AC

## 2015-04-15 ENCOUNTER — Ambulatory Visit: Payer: Self-pay | Admitting: Cardiology

## 2016-01-15 ENCOUNTER — Encounter (HOSPITAL_BASED_OUTPATIENT_CLINIC_OR_DEPARTMENT_OTHER): Payer: Self-pay | Admitting: Emergency Medicine

## 2016-01-15 ENCOUNTER — Emergency Department (HOSPITAL_BASED_OUTPATIENT_CLINIC_OR_DEPARTMENT_OTHER)
Admission: EM | Admit: 2016-01-15 | Discharge: 2016-01-15 | Disposition: A | Discharge status: Home / Self Care | Payer: No Typology Code available for payment source | Attending: Emergency Medicine | Admitting: Emergency Medicine

## 2016-01-15 ENCOUNTER — Emergency Department (HOSPITAL_BASED_OUTPATIENT_CLINIC_OR_DEPARTMENT_OTHER): Payer: No Typology Code available for payment source

## 2016-01-15 DIAGNOSIS — Z87891 Personal history of nicotine dependence: Secondary | ICD-10-CM | POA: Insufficient documentation

## 2016-01-15 DIAGNOSIS — J45909 Unspecified asthma, uncomplicated: Secondary | ICD-10-CM | POA: Diagnosis not present

## 2016-01-15 DIAGNOSIS — N189 Chronic kidney disease, unspecified: Secondary | ICD-10-CM | POA: Insufficient documentation

## 2016-01-15 DIAGNOSIS — S161XXA Strain of muscle, fascia and tendon at neck level, initial encounter: Secondary | ICD-10-CM | POA: Diagnosis not present

## 2016-01-15 DIAGNOSIS — Y999 Unspecified external cause status: Secondary | ICD-10-CM | POA: Insufficient documentation

## 2016-01-15 DIAGNOSIS — Y92410 Unspecified street and highway as the place of occurrence of the external cause: Secondary | ICD-10-CM | POA: Insufficient documentation

## 2016-01-15 DIAGNOSIS — I2510 Atherosclerotic heart disease of native coronary artery without angina pectoris: Secondary | ICD-10-CM | POA: Insufficient documentation

## 2016-01-15 DIAGNOSIS — Y9389 Activity, other specified: Secondary | ICD-10-CM | POA: Diagnosis not present

## 2016-01-15 DIAGNOSIS — S199XXA Unspecified injury of neck, initial encounter: Secondary | ICD-10-CM | POA: Diagnosis present

## 2016-01-15 MED ORDER — ONDANSETRON 4 MG PO TBDP
4.0000 mg | ORAL_TABLET | Freq: Once | ORAL | Status: AC
  Administered 2016-01-15: 4 mg via ORAL
  Filled 2016-01-15: qty 1

## 2016-01-15 MED ORDER — MORPHINE SULFATE (PF) 4 MG/ML IV SOLN
6.0000 mg | INTRAVENOUS | Status: DC | PRN
  Administered 2016-01-15: 6 mg via INTRAMUSCULAR
  Filled 2016-01-15: qty 2

## 2016-01-15 MED ORDER — OXYCODONE-ACETAMINOPHEN 5-325 MG PO TABS: 1.0000 | ORAL_TABLET | ORAL | 0 refills | Status: DC | PRN

## 2016-01-15 MED ORDER — METHOCARBAMOL 500 MG PO TABS: 1000.0000 mg | ORAL_TABLET | Freq: Four times a day (QID) | ORAL | 0 refills | Status: DC | PRN

## 2016-01-15 MED ORDER — METHOCARBAMOL 500 MG PO TABS
1000.0000 mg | ORAL_TABLET | Freq: Once | ORAL | Status: AC
  Administered 2016-01-15: 1000 mg via ORAL
  Filled 2016-01-15: qty 2
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
## 2016-01-15 NOTE — ED Notes (Signed)
Patient taken to bathroom via wheelchair, Patient took a few steps in restroom slow but steady states back felt a little better moving but neck still feels the same.
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
## 2016-01-15 NOTE — ED Notes (Signed)
Patient transported to CT 

## 2016-01-15 NOTE — Discharge Instructions (Signed)
Percocet is related to Vicodin (hydrocodone) it is possible that he may have a similar reaction that you had with itching. If this occurs please stop taking the Percocet and take Benadryl, if you develop any nausea, vomiting, shortness of breath lip or tongue swelling, 911 immediately.  Please take ibuprofen 400mg  (this is normally 2 over the counter pills) every 6 hours (take with food to minimze stomach irritation).   Take robaxin and/or percocet for breakthrough pain, do not drink alcohol, drive, care for children or perfom other critical tasks while taking robaxin and/or percocet.  Please follow with your primary care doctor in the next 2 days for a check-up. They must obtain records for further management.   Do not hesitate to return to the Emergency Department for any new, worsening or concerning symptoms.

## 2016-08-12 ENCOUNTER — Encounter (HOSPITAL_COMMUNITY): Payer: Self-pay

## 2016-08-12 ENCOUNTER — Emergency Department (HOSPITAL_COMMUNITY)
Admission: EM | Admit: 2016-08-12 | Discharge: 2016-08-12 | Disposition: A | Discharge status: Home / Self Care | Payer: Self-pay | Attending: Emergency Medicine | Admitting: Emergency Medicine

## 2016-08-12 ENCOUNTER — Emergency Department (HOSPITAL_COMMUNITY): Payer: Self-pay

## 2016-08-12 DIAGNOSIS — N189 Chronic kidney disease, unspecified: Secondary | ICD-10-CM | POA: Insufficient documentation

## 2016-08-12 DIAGNOSIS — Z87891 Personal history of nicotine dependence: Secondary | ICD-10-CM | POA: Insufficient documentation

## 2016-08-12 DIAGNOSIS — R079 Chest pain, unspecified: Secondary | ICD-10-CM | POA: Insufficient documentation

## 2016-08-12 DIAGNOSIS — J45909 Unspecified asthma, uncomplicated: Secondary | ICD-10-CM | POA: Insufficient documentation

## 2016-08-12 DIAGNOSIS — I2510 Atherosclerotic heart disease of native coronary artery without angina pectoris: Secondary | ICD-10-CM | POA: Insufficient documentation

## 2016-08-12 LAB — CBC
HCT: 40.7 % (ref 36.0–46.0)
HEMOGLOBIN: 12.5 g/dL (ref 12.0–15.0)
MCH: 22.9 pg — AB (ref 26.0–34.0)
MCHC: 30.7 g/dL (ref 30.0–36.0)
MCV: 74.4 fL — ABNORMAL LOW (ref 78.0–100.0)
PLATELETS: 187 10*3/uL (ref 150–400)
RBC: 5.47 MIL/uL — AB (ref 3.87–5.11)
RDW: 15.9 % — ABNORMAL HIGH (ref 11.5–15.5)
WBC: 8.7 10*3/uL (ref 4.0–10.5)

## 2016-08-12 LAB — BASIC METABOLIC PANEL
ANION GAP: 9 (ref 5–15)
BUN: 11 mg/dL (ref 6–20)
CALCIUM: 9.2 mg/dL (ref 8.9–10.3)
CHLORIDE: 106 mmol/L (ref 101–111)
CO2: 23 mmol/L (ref 22–32)
CREATININE: 0.96 mg/dL (ref 0.44–1.00)
GFR calc non Af Amer: >60 mL/min (ref >60)
Glucose, Bld: 81 mg/dL (ref 65–99)
Potassium: 4.3 mmol/L (ref 3.5–5.1)
SODIUM: 138 mmol/L (ref 135–145)

## 2016-08-12 LAB — I-STAT TROPONIN, ED: TROPONIN I, POC: 0 ng/mL (ref 0.00–0.08)

## 2016-08-12 NOTE — ED Notes (Signed)
Pt approached nurse first asking about wait times; RN assured her that she would be seen for her CP, RN inquired about the status of her current pain and related symptoms; pt stated she was still hurting but tired of waiting so long; RN informed that labs were resulted and needed an evaluation by MD and encouraged her to stay; pt insisted that she leave; RN informed her to return at any time for new/worsening symptoms; pt ambulatory out of ER with spouse

## 2016-08-12 NOTE — ED Triage Notes (Signed)
Pt complaining of central chest pain and pressure x 2 weeks. Pt states radiates to R chest. Pt also complaining of some lightheadedness and dizziness. Pt denies any cough or SOB. Pt denies any N/V.

## 2016-08-12 NOTE — ED Notes (Signed)
Pt aware of wait time for treatment room

## 2020-08-12 ENCOUNTER — Emergency Department (HOSPITAL_COMMUNITY): Payer: 59

## 2020-08-12 ENCOUNTER — Other Ambulatory Visit: Payer: Self-pay

## 2020-08-12 ENCOUNTER — Emergency Department (HOSPITAL_COMMUNITY)
Admission: EM | Admit: 2020-08-12 | Discharge: 2020-08-12 | Disposition: A | Discharge status: Home / Self Care | Payer: 59 | Attending: Emergency Medicine | Admitting: Emergency Medicine

## 2020-08-12 ENCOUNTER — Encounter (HOSPITAL_COMMUNITY): Payer: Self-pay

## 2020-08-12 DIAGNOSIS — X58XXXA Exposure to other specified factors, initial encounter: Secondary | ICD-10-CM | POA: Diagnosis not present

## 2020-08-12 DIAGNOSIS — N189 Chronic kidney disease, unspecified: Secondary | ICD-10-CM | POA: Insufficient documentation

## 2020-08-12 DIAGNOSIS — R52 Pain, unspecified: Secondary | ICD-10-CM

## 2020-08-12 DIAGNOSIS — Z87891 Personal history of nicotine dependence: Secondary | ICD-10-CM | POA: Insufficient documentation

## 2020-08-12 DIAGNOSIS — S46912A Strain of unspecified muscle, fascia and tendon at shoulder and upper arm level, left arm, initial encounter: Secondary | ICD-10-CM | POA: Insufficient documentation

## 2020-08-12 DIAGNOSIS — S4992XA Unspecified injury of left shoulder and upper arm, initial encounter: Secondary | ICD-10-CM | POA: Diagnosis present

## 2020-08-12 DIAGNOSIS — J45909 Unspecified asthma, uncomplicated: Secondary | ICD-10-CM | POA: Diagnosis not present

## 2020-08-12 MED ORDER — PREDNISONE 20 MG PO TABS: 20.0000 mg | ORAL_TABLET | Freq: Every day | ORAL | 0 refills | Status: AC

## 2020-08-12 NOTE — ED Provider Notes (Signed)
[facility] EMERGENCY DEPARTMENT Provider Note   CSN: [id number] Arrival date & time: 08/12/20  1957     History Chief Complaint  Patient presents with  . Shoulder Pain    Yvonne Cobb is a 50 y.o. female.  HPI   Patient with no significant medical history presents to the emergency department with chief complaint of left-sided shoulder pain.  She endorses this  started Friday night/early Saturday morning.  She endorses she woke up and had some left-sided shoulder pain, she felt a small lump on the back of her shoulder.  She endorses she has worsening pain with movement, describes it as a dull sensation which she feels from her shoulder blade into her deltoid, she denies paresthesia or weakness in that arm, she denies recent trauma to the area, denies autoimmune disease, denies IV drug use.  She was seen at [facility] they suspect a muscular strain started on muscle relaxers and narcotics without any relief.  Patient denies any alleviating factors.  Patient denies associated chest pain or shortness of breath, has no cardiac history, no history of PEs or DVTs, currently not on hormone therapy.  Patient denies headaches, fevers, chills, shortness of breath, chest pain, abdominal pain, nausea, vomiting, diarrhea, worsening pedal edema.  Past Medical History:  Diagnosis Date  . Abnormal stress test 04/10/2015  . Asthma    Patient states "asthmatic bronchitis"  . Chronic kidney disease    per patient, 75% right kidney function, 25% left kidney function  . Depression   . FH: CAD (coronary artery disease) 04/10/2015  . Fibromyalgia   . GERD (gastroesophageal reflux disease)     Patient Active Problem List   Diagnosis Date Noted  . Normal coronary arteries 04/11/2015  . Chest pain with moderate risk of acute coronary syndrome 04/10/2015  . FH: CAD (coronary artery disease) 04/10/2015  . Fibromyalgia 04/10/2015  . Asthma 04/10/2015  . GERD (gastroesophageal reflux disease)  04/10/2015  . Abnormal stress test 04/10/2015    Past Surgical History:  Procedure Laterality Date  . ABDOMINAL HYSTERECTOMY  2006   partial; left tube and ovary removal  . CARDIAC CATHETERIZATION N/A 04/10/2015   Procedure: Left Heart Cath and Coronary Angiography;  Surgeon: Lyn Records, MD;  Location: [facility] INVASIVE CV LAB;  Service: Cardiovascular;  Laterality: N/A;  . CESAREAN SECTION    . ESOPHAGOGASTRODUODENOSCOPY    . KIDNEY SURGERY  1990-2006   Patient states that she has had at least 10 surgeries on her left kidney  . TONSILLECTOMY    . TUBAL LIGATION  1997     OB History   No obstetric history on file.     Family History  Problem Relation Age of Onset  . CAD Mother   . Diabetes Mother   . Hypertension Mother   . Heart attack Mother   . Lung cancer Father   . Diabetes Father   . Kidney disease Father   . Diabetes Sister   . Diabetes Sister     Social History   Tobacco Use  . Smoking status: Former Smoker    Years: 5.00    Types: Cigars    Quit date: 05/29/2013    Years since quitting: 7.2  . Smokeless tobacco: Never Used  Vaping Use  . Vaping Use: Never used  Substance Use Topics  . Alcohol use: No  . Drug use: No    Home Medications Prior to Admission medications   Medication Sig Start Date End Date Taking? Authorizing  Provider  predniSONE (DELTASONE) 20 MG tablet Take 1 tablet (20 mg total) by mouth daily for 5 days. 08/12/20 08/17/20 Yes Carroll Sage, PA-C  acetaminophen (TYLENOL) 325 MG tablet Take 2 tablets (650 mg total) by mouth every 4 (four) hours as needed for headache or mild pain. 04/11/15   Abelino Derrick, PA-C  esomeprazole (NEXIUM) 20 MG capsule Take 20 mg by mouth daily at 12 noon.    [provider]  methocarbamol (ROBAXIN) 500 MG tablet Take 2 tablets (1,000 mg total) by mouth 4 (four) times daily as needed (Pain). 01/15/16   Pisciotta, Joni Reining, PA-C  oxyCODONE-acetaminophen (PERCOCET) 5-325 MG tablet Take 1 tablet by  mouth every 4 (four) hours as needed. 01/15/16   Pisciotta, Joni Reining, PA-C    Allergies    Hydrocodone and Tape  Review of Systems   Review of Systems  Constitutional: Negative for chills and fever.  HENT: Negative for congestion.   Respiratory: Negative for shortness of breath.   Cardiovascular: Negative for chest pain.  Gastrointestinal: Negative for abdominal pain.  Genitourinary: Negative for enuresis.  Musculoskeletal: Negative for back pain.       Left shoulder pain.  Skin: Negative for rash.  Neurological: Negative for dizziness.  Hematological: Does not bruise/bleed easily.    Physical Exam Updated Vital Signs BP 138/72   Pulse 68   Temp 98 F (36.7 C) (Oral)   Resp 16   Ht 5\' 6"  (1.676 m)   Wt 113.4 kg   SpO2 100%   BMI 40.35 kg/m   Physical Exam Vitals and nursing note reviewed.  Constitutional:      General: She is not in acute distress.    Appearance: She is not ill-appearing.  HENT:     Head: Normocephalic and atraumatic.     Nose: No congestion.  Eyes:     Conjunctiva/sclera: Conjunctivae normal.  Cardiovascular:     Rate and Rhythm: Normal rate and regular rhythm.     Pulses: Normal pulses.     Heart sounds: No murmur heard. No friction rub. No gallop.   Pulmonary:     Effort: No respiratory distress.     Breath sounds: No wheezing, rhonchi or rales.  Musculoskeletal:     Comments: Patient's left shoulder was visualized there is no gross abnormalities noted, she had full range of motion at her fingers, wrist, elbow, shoulder, she was slightly tender to palpation along her scapula on the medial border, no deformities or crepitus present.    Spine was palpated nontender to palpation, no step-off or deformities present  Skin:    General: Skin is warm and dry.  Neurological:     Mental Status: She is alert.  Psychiatric:        Mood and Affect: Mood normal.     ED Results / Procedures / Treatments   Labs (all labs ordered are listed, but only  abnormal results are displayed) Labs Reviewed - No data to display  EKG None  Radiology DG Shoulder Left  Result Date: 08/12/2020 CLINICAL DATA:  Left shoulder pain for 5 days EXAM: LEFT SHOULDER - 2+ VIEW COMPARISON:  None. FINDINGS: Frontal, transscapular, and axillary views of the left shoulder are obtained. No fracture, subluxation, or dislocation. Joint spaces are well preserved. Left chest is clear. IMPRESSION: 1. Unremarkable left shoulder. Electronically Signed   By: 08/14/2020 M.D.   On: 08/12/2020 20:38    Procedures Procedures   Medications Ordered in ED Medications - No data to  display  ED Course  I have reviewed the triage vital signs and the nursing notes.  Pertinent labs & imaging results that were available during my care of the patient were reviewed by me and considered in my medical decision making (see chart for details).    MDM Rules/Calculators/A&P                         Initial impression-patient presents with left shoulder pain.  She is alert, does not appear acute distress, vital signs reassuring.  Will obtain imaging for further evaluation.  Work-up-x-ray negative for acute findings.  Rule out- I have low suspicion for septic arthritis as patient denies IV drug use, skin exam was performed no erythematous, edematous, warm joints noted on exam.   Low suspicion for fracture or dislocation as x-ray does not feel any significant findings. low suspicion for ligament or tendon damage as area was palpated no gross defects noted, she  had full range of motion in her left upper extremity.  Low suspicion for compartment syndrome as area was palpated it was soft to the touch, neurovascular fully intact.  Low suspicion for ACS patient denies chest pain, shortness of breath, history is atypical, patient has low risk factors.  Plan-I suspect patient suffering from a muscular strain will start her on steroids have her continue with her muscle relaxers and pain  medication. follow-up with PCP or orthopedic surgery for further evaluation.  Vital signs have remained stable, no indication for hospital admission.  Patient given at home care as well strict return precautions.  Patient verbalized that they understood agreed to said plan.   Final Clinical Impression(s) / ED Diagnoses Final diagnoses:  Pain  Strain of left shoulder, initial encounter    Rx / DC Orders ED Discharge Orders         Ordered    predniSONE (DELTASONE) 20 MG tablet  Daily        08/12/20 2210           Carroll Sage, PA-C 08/12/20 2330    Vanetta Mulders, MD 08/19/20 [phone]

## 2020-08-12 NOTE — ED Triage Notes (Signed)
Pt to er pt states that she is here for L shoulder pain, states that she woke up with the pain Friday morning, states that it hurt to move her arm and it hurts to sleep on it, states that she went to [facility] on Monday and was told that it was a sprain and given a muscle relaxer, states that she has been in bed since with ice and heat and the pain med, states that the pain medication makes her sleepy but doesn't help with the pain.  Pt denies chest pain, denies shortness of breath, states that the pain goes down her L arm, denies any known injury or traumatic event.

## 2020-08-12 NOTE — Discharge Instructions (Signed)
You have been seen here for left shoulder pain, I have started you on steroids please take as prescribed.  I recommend taking over-the-counter pain medications like ibuprofen and/or Tylenol every 6 as needed.  Please follow dosage and on the back of bottle.  I also recommend applying heat to the area and stretching out the muscles as this will help decrease stiffness and pain.  I have given you information on exercises please follow.  I want to follow-up with your PCP or orthopedic surgery for further evaluation of symptoms you improve after 1 week's time.  Come back to the emergency department if you develop chest pain, shortness of breath, severe abdominal pain, uncontrolled nausea, vomiting, diarrhea.

## 2020-08-12 NOTE — ED Notes (Signed)
ICE PACK PROVIDED

## 2021-08-11 ENCOUNTER — Other Ambulatory Visit (HOSPITAL_COMMUNITY): Payer: Self-pay | Admitting: Emergency Medicine

## 2021-08-11 ENCOUNTER — Other Ambulatory Visit: Payer: Self-pay

## 2021-08-11 ENCOUNTER — Ambulatory Visit (HOSPITAL_COMMUNITY)
Admission: RE | Admit: 2021-08-11 | Discharge: 2021-08-11 | Disposition: A | Discharge status: Home / Self Care | Payer: 59 | Source: Ambulatory Visit | Attending: Emergency Medicine | Admitting: Emergency Medicine

## 2021-08-11 DIAGNOSIS — R109 Unspecified abdominal pain: Secondary | ICD-10-CM

## 2022-10-28 ENCOUNTER — Encounter: Payer: Self-pay | Admitting: Emergency Medicine

## 2022-10-28 ENCOUNTER — Other Ambulatory Visit: Payer: Self-pay

## 2022-10-28 ENCOUNTER — Ambulatory Visit (INDEPENDENT_AMBULATORY_CARE_PROVIDER_SITE_OTHER)
Admission: EM | Admit: 2022-10-28 | Discharge: 2022-10-28 | Disposition: A | Discharge status: Home / Self Care | Payer: 59 | Source: Home / Self Care

## 2022-10-28 ENCOUNTER — Emergency Department (HOSPITAL_COMMUNITY)
Admission: EM | Admit: 2022-10-28 | Discharge: 2022-10-28 | Disposition: A | Discharge status: Home / Self Care | Payer: 59 | Attending: Emergency Medicine | Admitting: Emergency Medicine

## 2022-10-28 ENCOUNTER — Encounter (HOSPITAL_COMMUNITY): Payer: Self-pay | Admitting: Emergency Medicine

## 2022-10-28 DIAGNOSIS — H538 Other visual disturbances: Secondary | ICD-10-CM

## 2022-10-28 DIAGNOSIS — R001 Bradycardia, unspecified: Secondary | ICD-10-CM

## 2022-10-28 DIAGNOSIS — R5383 Other fatigue: Secondary | ICD-10-CM | POA: Insufficient documentation

## 2022-10-28 DIAGNOSIS — R300 Dysuria: Secondary | ICD-10-CM

## 2022-10-28 DIAGNOSIS — R42 Dizziness and giddiness: Secondary | ICD-10-CM

## 2022-10-28 DIAGNOSIS — Z955 Presence of coronary angioplasty implant and graft: Secondary | ICD-10-CM | POA: Diagnosis not present

## 2022-10-28 LAB — BASIC METABOLIC PANEL
Anion gap: 9 (ref 5–15)
BUN: 16 mg/dL (ref 6–20)
CO2: 23 mmol/L (ref 22–32)
Calcium: 9.2 mg/dL (ref 8.9–10.3)
Chloride: 105 mmol/L (ref 98–111)
Creatinine, Ser: 0.94 mg/dL (ref 0.44–1.00)
GFR, Estimated: >60 mL/min (ref >60)
Glucose, Bld: 92 mg/dL (ref 70–99)
Potassium: 4.3 mmol/L (ref 3.5–5.1)
Sodium: 137 mmol/L (ref 135–145)

## 2022-10-28 LAB — CBC
HCT: 42.6 % (ref 36.0–46.0)
Hemoglobin: 12.6 g/dL (ref 12.0–15.0)
MCH: 23 pg — ABNORMAL LOW (ref 26.0–34.0)
MCHC: 29.6 g/dL — ABNORMAL LOW (ref 30.0–36.0)
MCV: 77.7 fL — ABNORMAL LOW (ref 80.0–100.0)
Platelets: 203 10*3/uL (ref 150–400)
RBC: 5.48 MIL/uL — ABNORMAL HIGH (ref 3.87–5.11)
RDW: 16.1 % — ABNORMAL HIGH (ref 11.5–15.5)
WBC: 8.2 10*3/uL (ref 4.0–10.5)
nRBC: 0 % (ref 0.0–0.2)

## 2022-10-28 LAB — POCT URINALYSIS DIP (MANUAL ENTRY)
Bilirubin, UA: NEGATIVE
Glucose, UA: NEGATIVE mg/dL
Ketones, POC UA: NEGATIVE mg/dL
Nitrite, UA: NEGATIVE
Protein Ur, POC: NEGATIVE mg/dL
Spec Grav, UA: 1.025 (ref 1.010–1.025)
Urobilinogen, UA: 0.2 E.U./dL
pH, UA: 5.5 (ref 5.0–8.0)

## 2022-10-28 LAB — POCT FASTING CBG KUC MANUAL ENTRY: POCT Glucose (KUC): 94 mg/dL (ref 70–99)

## 2022-10-28 LAB — CBG MONITORING, ED: Glucose-Capillary: 85 mg/dL (ref 70–99)

## 2022-10-28 MED ORDER — SULFAMETHOXAZOLE-TRIMETHOPRIM 800-160 MG PO TABS: 1.0000 | ORAL_TABLET | Freq: Two times a day (BID) | ORAL | 0 refills | Status: AC

## 2022-10-28 NOTE — ED Triage Notes (Signed)
Feels dizzy since yesterday, blurry vision even while wearing glasses that started today.  States urine is dark a week ago, some burning on urination since yesterday with frequent urination.

## 2022-10-28 NOTE — ED Triage Notes (Signed)
Pt sent from UC for eval of fatigue and dizziness x 2 days with blurred vision and bradycardia. Pt was diagnosed with UTI at [facility] with rx for bactrim sent. Pt denies pain.

## 2022-10-28 NOTE — ED Provider Notes (Addendum)
[facility]    CSN: [id number] Arrival date & time: 10/28/22  1415      History   Chief Complaint No chief complaint on file.   HPI Yvonne Cobb is a 52 y.o. female who presents due to developing dizziness and  blurred vision even when wearing her glasses since today. Last week she had dysuria and dark urine, and since yesterday, has had frequency. Has been very fatigued and sleepy. Denies CP, SOB, URI symptoms, fever or aches. Has past hx of anemia. Has had a normal heart cath 2016. Does not have a PCP currently.  She describes her dizziness as feeling off balance.    Past Medical History:  Diagnosis Date   Abnormal stress test 04/10/2015   Asthma    Patient states "asthmatic bronchitis"   Chronic kidney disease    per patient, 75% right kidney function, 25% left kidney function   Depression    FH: CAD (coronary artery disease) 04/10/2015   Fibromyalgia    GERD (gastroesophageal reflux disease)     Patient Active Problem List   Diagnosis Date Noted   Normal coronary arteries 04/11/2015   Chest pain with moderate risk of acute coronary syndrome 04/10/2015   FH: CAD (coronary artery disease) 04/10/2015   Fibromyalgia 04/10/2015   Asthma 04/10/2015   GERD (gastroesophageal reflux disease) 04/10/2015   Abnormal stress test 04/10/2015    Past Surgical History:  Procedure Laterality Date   ABDOMINAL HYSTERECTOMY  2006   partial; left tube and ovary removal   CARDIAC CATHETERIZATION N/A 04/10/2015   Procedure: Left Heart Cath and Coronary Angiography;  Surgeon: Lyn Records, MD;  Location: [facility] INVASIVE CV LAB;  Service: Cardiovascular;  Laterality: N/A;   CESAREAN SECTION     ESOPHAGOGASTRODUODENOSCOPY     KIDNEY SURGERY  1990-2006   Patient states that she has had at least 10 surgeries on her left kidney   TONSILLECTOMY     TUBAL LIGATION  1997    OB History   No obstetric history on file.      Home Medications    Prior to Admission  medications   Medication Sig Start Date End Date Taking? Authorizing Provider  albuterol (VENTOLIN HFA) 108 (90 Base) MCG/ACT inhaler Inhale into the lungs every 6 (six) hours as needed for wheezing or shortness of breath.   Yes [provider]  sulfamethoxazole-trimethoprim (BACTRIM DS) 800-160 MG tablet Take 1 tablet by mouth 2 (two) times daily for 7 days. 10/28/22 11/04/22 Yes Rodriguez-Southworth, Nettie Elm, PA-C  acetaminophen (TYLENOL) 325 MG tablet Take 2 tablets (650 mg total) by mouth every 4 (four) hours as needed for headache or mild pain. 04/11/15   Abelino Derrick, PA-C  esomeprazole (NEXIUM) 20 MG capsule Take 20 mg by mouth daily at 12 noon.    [provider]    Family History Family History  Problem Relation Age of Onset   CAD Mother    Diabetes Mother    Hypertension Mother    Heart attack Mother    Lung cancer Father    Diabetes Father    Kidney disease Father    Diabetes Sister    Diabetes Sister     Social History Social History   Tobacco Use   Smoking status: Former    Types: Cigars    Quit date: 05/29/2013    Years since quitting: 9.4   Smokeless tobacco: Never  Vaping Use   Vaping Use: Never used  Substance Use Topics  Alcohol use: No   Drug use: No     Allergies   Hydrocodone and Tape   Review of Systems Review of Systems  Constitutional:  Positive for fatigue. Negative for fever.  HENT:  Negative for congestion, ear discharge, ear pain, postnasal drip and rhinorrhea.   Eyes:  Positive for visual disturbance. Negative for discharge.  Respiratory:  Negative for cough and shortness of breath.   Cardiovascular:  Negative for chest pain.  Gastrointestinal:  Negative for abdominal pain.  Genitourinary:  Positive for dysuria and frequency. Negative for flank pain.  Neurological:  Positive for dizziness, light-headedness and headaches. Negative for syncope, weakness and numbness.       Mild, but not new     Physical Exam Triage  Vital Signs ED Triage Vitals  Enc Vitals Group     BP 10/28/22 1423 130/84     Pulse Rate 10/28/22 1423 (!) 56     Resp 10/28/22 1423 18     Temp 10/28/22 1423 98 F (36.7 C)     Temp Source 10/28/22 1423 Oral     SpO2 10/28/22 1423 98 %     Weight --      Height --      Head Circumference --      Peak Flow --      Pain Score 10/28/22 1425 0     Pain Loc --      Pain Edu? --      Excl. in GC? --    Orthostatic VS for the past 24 hrs:  BP- Lying Pulse- Lying BP- Sitting Pulse- Sitting BP- Standing at 0 minutes Pulse- Standing at 0 minutes  10/28/22 1436 132/84 55 132/86 57 118/82 61    Updated Vital Signs BP 130/84 (BP Location: Right Arm)   Pulse (!) 56   Temp 98 F (36.7 C) (Oral)   Resp 18   SpO2 98%   Visual Acuity Right Eye Distance: 20/20 Left Eye Distance: 20/25 Bilateral Distance: 20/20  Right Eye Near:   Left Eye Near:    Bilateral Near:     Physical Exam Physical Exam Vitals signs and nursing note reviewed.  Constitutional:      General: She is not in acute distress.    Appearance: Normal appearance. She is not ill-appearing, toxic-appearing or diaphoretic.  HENT:     Head: Normocephalic.     Right Ear: Tympanic membrane, ear canal and external ear normal.     Left Ear: Tympanic membrane, ear canal and external ear normal.     Nose: Nose normal.     Mouth/Throat:     Mouth: Mucous membranes are moist.  Eyes:     General: No scleral icterus.       Right eye: No discharge.        Left eye: No discharge.     Conjunctiva/sclera: Conjunctivae normal.  Neck:     Musculoskeletal: Neck supple. No neck rigidity.  Cardiovascular:     Huston Foley rhythm and Rhythm: Normal rate and regular rhythm.     Heart sounds: 2/6  murmur.  Pulmonary:     Effort: Pulmonary effort is normal.     Breath sounds: Normal breath sounds.  Musculoskeletal: Normal range of motion.  Lymphadenopathy:     Cervical: No cervical adenopathy.  Skin:    General: Skin is warm and dry.      Coloration: Skin is not jaundiced.     Findings: No rash.  Neurological:     Mental  Status: She is alert and oriented to person, place, and time.     Gait: Gait normal. Has normal rhomberg and finger to nose. Strength is 5/5 and symmetric on upper and lower extremities Psychiatric:        Mood and Affect: Mood normal.        Behavior: Behavior normal.        Thought Content: Thought content normal.        Judgment: Judgment normal.    UC Treatments / Results  Labs (all labs ordered are listed, but only abnormal results are displayed) Labs Reviewed  POCT URINALYSIS DIP (MANUAL ENTRY) - Abnormal; Notable for the following components:      Result Value   Blood, UA trace-lysed (*)    Leukocytes, UA Small (1+) (*)    All other components within normal limits  URINE CULTURE  POCT FASTING CBG [facility] MANUAL ENTRY    EKG sinus bradycardia, otherwise is normal.   Radiology No results found.  Procedures Procedures (including critical care time)  Medications Ordered in UC Medications - No data to display  Initial Impression / Assessment and Plan / UC Course  I have reviewed the triage vital signs and the nursing notes. I reviewed her past vitals and her pulse has been in the upper 60's before Pertinent labs & imaging results that were available during my care of the patient were reviewed by me and considered in my medical decision making (see chart for details).  She was sent to ER for further work up I sent Bactrim as noted for UTI.    Final Clinical Impressions(s) / UC Diagnoses   Final diagnoses:  Dysuria  Other fatigue  Bradycardia  Dizziness and giddiness  Blurred vision, bilateral     Discharge Instructions      I sent Bactrim antibiotic for the bladder infection I ordered a urine culture and if the results come back abnormal, and if we have to change the antibiotic, we will notify you.   Your EKG shows slow heart rate and with your fatigue and dizziness  with vision changes, you need to have more test done, which we can't do here.      ED Prescriptions     Medication Sig Dispense Auth. Provider   sulfamethoxazole-trimethoprim (BACTRIM DS) 800-160 MG tablet Take 1 tablet by mouth 2 (two) times daily for 7 days. 14 tablet Rodriguez-Southworth, Nettie Elm, PA-C      PDMP not reviewed this encounter.   Garey Ham, PA-C 10/28/22 1510    Rodriguez-Southworth, [place], PA-C 10/28/22 1511    Rodriguez-Southworth, [place], PA-C 10/28/22 2055

## 2022-10-28 NOTE — ED Notes (Signed)
Pt ambulated around nurses desk without c/o dizziness, HR maintained around 58-62 with ambulation.  RAs sats 98-100%.

## 2022-10-28 NOTE — Discharge Instructions (Addendum)
I sent Bactrim antibiotic for the bladder infection I ordered a urine culture and if the results come back abnormal, and if we have to change the antibiotic, we will notify you.   Your EKG shows slow heart rate and with your fatigue and dizziness with vision changes, you need to have more test done, which we can't do here.

## 2022-10-28 NOTE — ED Provider Notes (Signed)
[place] EMERGENCY DEPARTMENT AT [facility] Provider Note   CSN: [id number] Arrival date & time: 10/28/22  1515     History {Add pertinent medical, surgical, social history, OB history to HPI:1} Chief Complaint  Patient presents with   Abnormal ECG   Bradycardia    Yvonne Cobb is a 52 y.o. female.  HPI      Yvonne Cobb is a 52 y.o. female who presents to the Emergency Department sent from urgent care for evaluation of bradycardia, fatigue, blurred vision.  Reports fatigue and dizziness with position change x 2 days, intermittent blurred vision as well.  Was seen in urgent care earlier today was noted to have some dysuria symptoms as well.  Diagnosed with urinary tract infection and received prescription for Bactrim which patient has not yet taken.  Was noted to have heart rate of 56 at urgent care and sent here for further evaluation.  Patient denies any chest pain, dyspnea, vomiting, diarrhea, abdominal pain, headache, syncope fever or chills.  No new medications.  Does note history of coronary artery disease, had normal heart catheterization in 2016 echocardiogram with EF of 55 to 60%.  Home Medications Prior to Admission medications   Medication Sig Start Date End Date Taking? Authorizing Provider  acetaminophen (TYLENOL) 325 MG tablet Take 2 tablets (650 mg total) by mouth every 4 (four) hours as needed for headache or mild pain. 04/11/15   Abelino Derrick, PA-C  albuterol (VENTOLIN HFA) 108 (90 Base) MCG/ACT inhaler Inhale into the lungs every 6 (six) hours as needed for wheezing or shortness of breath.    [provider]  esomeprazole (NEXIUM) 20 MG capsule Take 20 mg by mouth daily at 12 noon.    [provider]  sulfamethoxazole-trimethoprim (BACTRIM DS) 800-160 MG tablet Take 1 tablet by mouth 2 (two) times daily for 7 days. 10/28/22 11/04/22  Rodriguez-Southworth, Nettie Elm, PA-C      Allergies    Hydrocodone and Tape    Review of  Systems   Review of Systems  Constitutional:  Negative for appetite change, chills, fatigue and fever.  Eyes:  Positive for visual disturbance. Negative for pain.  Respiratory:  Negative for shortness of breath.   Cardiovascular:  Negative for chest pain and palpitations.  Gastrointestinal:  Negative for abdominal pain, nausea and vomiting.  Genitourinary:  Positive for dysuria.  Musculoskeletal:  Negative for arthralgias and back pain.  Skin:  Negative for rash.  Neurological:  Positive for dizziness. Negative for headaches.  Psychiatric/Behavioral:  Negative for confusion.     Physical Exam Updated Vital Signs BP 132/71   Pulse (!) 53   Temp 99.9 F (37.7 C) (Oral)   Resp 14   Ht 5\' 6"  (1.676 m)   Wt 104.3 kg   SpO2 100%   BMI 37.12 kg/m  Physical Exam Vitals and nursing note reviewed.  Constitutional:      General: She is not in acute distress.    Appearance: Normal appearance. She is not ill-appearing or toxic-appearing.  Cardiovascular:     Rate and Rhythm: Normal rate and regular rhythm.     Pulses: Normal pulses.  Pulmonary:     Effort: Pulmonary effort is normal.  Abdominal:     General: There is no distension.     Palpations: Abdomen is soft.     Tenderness: There is no abdominal tenderness. There is no right CVA tenderness or left CVA tenderness.  Musculoskeletal:  General: Normal range of motion.  Skin:    General: Skin is warm.     Capillary Refill: Capillary refill takes less than 2 seconds.  Neurological:     General: No focal deficit present.     Mental Status: She is alert.     Sensory: No sensory deficit.     Motor: No weakness.     ED Results / Procedures / Treatments   Labs (all labs ordered are listed, but only abnormal results are displayed) Labs Reviewed  CBC - Abnormal; Notable for the following components:      Result Value   RBC 5.48 (*)    MCV 77.7 (*)    MCH 23.0 (*)    MCHC 29.6 (*)    RDW 16.1 (*)    All other  components within normal limits  BASIC METABOLIC PANEL  CBG MONITORING, ED    EKG EKG Interpretation  Date/Time:  Friday Oct 28 2022 15:28:15 EDT Ventricular Rate:  53 PR Interval:  152 QRS Duration: 90 QT Interval:  434 QTC Calculation: 407 R Axis:   56 Text Interpretation: Sinus bradycardia Possible Left atrial enlargement Septal infarct , age undetermined Abnormal ECG When compared with ECG of 28-Oct-2022 14:58, No significant change was found Confirmed by Vanetta Mulders [phone]) on 10/28/2022 6:39:15 PM  Radiology No results found.  Procedures Procedures  {Document cardiac monitor, telemetry assessment procedure when appropriate:1}  Medications Ordered in ED Medications - No data to display  ED Course/ Medical Decision Making/ A&P   {   Click here for ABCD2, HEART and other calculatorsREFRESH Note before signing :1}                          Medical Decision Making Patient is an here for evaluation of bradycardia, blurred vision and dizziness.  Dizziness described as sensation of feeling off balanced with movement.  No headache chest pain or dyspnea.  No new medications.  Diagnosed with UTI at urgent care earlier today prescription was written for Bactrim but patient has not started the medication  Differential would include but not limited to viral process, cardiac arrhythmia, sepsis considered but felt less likely.  Her blood pressure is reassuring.  There is no tachypnea or hypotension.  Amount and/or Complexity of Data Reviewed Labs: ordered.    Details: Labs interpreted by me, no evidence of leukocytosis, hemoglobin unremarkable.  Chemistries unremarkable CBG of 85 ECG/medicine tests: ordered.    Details: EKG shows sinus bradycardia possible left atrial enlargement  Of note, patient also bradycardic on EKG in 2018 Discussion of management or test interpretation with external provider(s): Orthostatics here reassuring.  No orthostatic hypotension.  Ambulated in the  department without symptoms and had appropriate increase of heart rate.    I feel that she is appropriate for discharge home, discussed findings with Dr. Deretha Emory.  Pt will f/u early next week with PCP.   Return precautions were given.   Orthostatic Lying BP- Lying: 144/75 (rt arm) Pulse- Lying: 49 Abnormal  Orthostatic Sitting BP- Sitting: 144/76 (no dizziness with sitting) Pulse- Sitting: 48 Abnormal  Orthostatic Standing at 0 minutes BP- Standing at 0 minutes: 153/79 (denies any dizziness with standing) Pulse- Standing at 0 minutes: 51  {Document critical care time when appropriate:1} {Document review of labs and clinical decision tools ie heart score, Chads2Vasc2 etc:1}  {Document your independent review of radiology images, and any outside records:1} {Document your discussion with family members, caretakers, and with consultants:1} {  Document social determinants of health affecting pt's care:1} {Document your decision making why or why not admission, treatments were needed:1} Final Clinical Impression(s) / ED Diagnoses Final diagnoses:  None    Rx / DC Orders ED Discharge Orders     None

## 2022-10-28 NOTE — Discharge Instructions (Signed)
Please follow-up with your primary care provider early next week.  Take the antibiotic that you were previously prescribed.  Return to the emergency department for any new or worsening symptoms.

## 2022-10-29 LAB — URINE CULTURE: Culture: <10000 — AB

## 2022-11-14 NOTE — Progress Notes (Unsigned)
   New Patient Office Visit  Subjective:  Patient ID: Yvonne Cobb, female    DOB: 08/19/1970  Age: 52 y.o. MRN: [id number]  CC: No chief complaint on file.   HPI Yvonne Cobb is a 52 y.o. female with past medical history of *** presents for establishing care.  Past Medical History:  Diagnosis Date   Abnormal stress test 04/10/2015   Asthma    Patient states "asthmatic bronchitis"   Chronic kidney disease    per patient, 75% right kidney function, 25% left kidney function   Depression    FH: CAD (coronary artery disease) 04/10/2015   Fibromyalgia    GERD (gastroesophageal reflux disease)     Past Surgical History:  Procedure Laterality Date   ABDOMINAL HYSTERECTOMY  2006   partial; left tube and ovary removal   CARDIAC CATHETERIZATION N/A 04/10/2015   Procedure: Left Heart Cath and Coronary Angiography;  Surgeon: Lyn Records, MD;  Location: [facility] INVASIVE CV LAB;  Service: Cardiovascular;  Laterality: N/A;   CESAREAN SECTION     ESOPHAGOGASTRODUODENOSCOPY     KIDNEY SURGERY  1990-2006   Patient states that she has had at least 10 surgeries on her left kidney   TONSILLECTOMY     TUBAL LIGATION  1997    Family History  Problem Relation Age of Onset   CAD Mother    Diabetes Mother    Hypertension Mother    Heart attack Mother    Lung cancer Father    Diabetes Father    Kidney disease Father    Diabetes Sister    Diabetes Sister     Social History   Socioeconomic History   Marital status: Divorced    Spouse name: Not on file   Number of children: Not on file   Years of education: Not on file   Highest education level: Not on file  Occupational History   Not on file  Tobacco Use   Smoking status: Former    Types: Cigars    Quit date: 05/29/2013    Years since quitting: 9.4   Smokeless tobacco: Never  Vaping Use   Vaping Use: Never used  Substance and Sexual Activity   Alcohol use: No   Drug use: No   Sexual activity: Not on file  Other  Topics Concern   Not on file  Social History Narrative   Not on file   Social Determinants of Health   Financial Resource Strain: Not on file  Food Insecurity: Not on file  Transportation Needs: Not on file  Physical Activity: Not on file  Stress: Not on file  Social Connections: Not on file  Intimate Partner Violence: Not on file    ROS Review of Systems  Objective:   Today's Vitals: There were no vitals taken for this visit.  Physical Exam   Assessment & Plan:   There are no diagnoses linked to this encounter.   Follow-up: No follow-ups on file.   Gilmore Laroche, FNP

## 2022-11-15 ENCOUNTER — Encounter: Payer: Self-pay | Admitting: Family Medicine

## 2022-11-15 ENCOUNTER — Ambulatory Visit (INDEPENDENT_AMBULATORY_CARE_PROVIDER_SITE_OTHER): Payer: 59 | Admitting: Family Medicine

## 2022-11-15 VITALS — BP 125/84 | HR 106 | Ht 66.0 in | Wt 237.1 lb

## 2022-11-15 DIAGNOSIS — E038 Other specified hypothyroidism: Secondary | ICD-10-CM

## 2022-11-15 DIAGNOSIS — Z114 Encounter for screening for human immunodeficiency virus [HIV]: Secondary | ICD-10-CM

## 2022-11-15 DIAGNOSIS — Z23 Encounter for immunization: Secondary | ICD-10-CM

## 2022-11-15 DIAGNOSIS — E7849 Other hyperlipidemia: Secondary | ICD-10-CM | POA: Diagnosis not present

## 2022-11-15 DIAGNOSIS — E559 Vitamin D deficiency, unspecified: Secondary | ICD-10-CM | POA: Diagnosis not present

## 2022-11-15 DIAGNOSIS — Z1159 Encounter for screening for other viral diseases: Secondary | ICD-10-CM

## 2022-11-15 DIAGNOSIS — R7301 Impaired fasting glucose: Secondary | ICD-10-CM

## 2022-11-15 DIAGNOSIS — R001 Bradycardia, unspecified: Secondary | ICD-10-CM

## 2022-11-15 DIAGNOSIS — Z1231 Encounter for screening mammogram for malignant neoplasm of breast: Secondary | ICD-10-CM

## 2022-11-15 DIAGNOSIS — Z1211 Encounter for screening for malignant neoplasm of colon: Secondary | ICD-10-CM

## 2022-11-15 NOTE — Patient Instructions (Addendum)
I appreciate the opportunity to provide care to you today!    Follow up:  3 months  Labs: please stop by the lab today to get your blood drawn (CBC, CMP, TSH, Lipid profile, HgA1c, Vit D)  Screening: HIV and Hep C  Please schedule mammogram  Thank you for getting your Tdap vaccine today  Referrals today- cardiology   Please continue to a heart-healthy diet and increase your physical activities. Try to exercise for at least five days a week.      It was a pleasure to see you and I look forward to continuing to work together on your health and well-being. Please do not hesitate to call the office if you need care or have questions about your care.   Have a wonderful day and week. With Gratitude, Gilmore Laroche MSN, FNP-BC

## 2022-11-15 NOTE — Assessment & Plan Note (Addendum)
The patient was seen in the ED on 10/28/2022 for evaluation of bradycardia with symptoms of fatigue, blurred vision, and dizziness.  Heart rate was noted to be 56 in the urgent care.  Of note, the patient has a history of coronary tree disease and had a normal heart catheterization in 2016 echocardiogram with EF of 55 to 60%.  She does not follow-up with cardiology.  EKG completed in the ED shows sinus bradycardia with possible left atrial enlargement septic infract.  The patient reports today that she has been occasionally dizzy with certain activities.  She described her dizziness as feeling off balance.  She notes feeling lightheaded all the time.  BP is stable and she reports adequate fluid consumption.  No recent syncope, violent vomiting, dyspnea, and chest pain reported.  Will place a referral to cardiology for further evaluation

## 2022-11-16 ENCOUNTER — Other Ambulatory Visit: Payer: Self-pay | Admitting: Family Medicine

## 2022-11-16 DIAGNOSIS — E559 Vitamin D deficiency, unspecified: Secondary | ICD-10-CM

## 2022-11-16 DIAGNOSIS — Z1211 Encounter for screening for malignant neoplasm of colon: Secondary | ICD-10-CM | POA: Diagnosis not present

## 2022-11-16 LAB — CMP14+EGFR
ALT: 15 IU/L (ref 0–32)
AST: 12 IU/L (ref 0–40)
Albumin: 4.3 g/dL (ref 3.9–4.9)
Alkaline Phosphatase: 100 IU/L (ref 44–121)
BUN/Creatinine Ratio: 16 (ref 9–23)
BUN: 16 mg/dL (ref 6–24)
Bilirubin Total: 0.4 mg/dL (ref 0.0–1.2)
CO2: 25 mmol/L (ref 20–29)
Calcium: 9.8 mg/dL (ref 8.7–10.2)
Chloride: 103 mmol/L (ref 96–106)
Creatinine, Ser: 1.01 mg/dL — ABNORMAL HIGH (ref 0.57–1.00)
Globulin, Total: 2.8 g/dL (ref 1.5–4.5)
Glucose: 91 mg/dL (ref 70–99)
Potassium: 4.8 mmol/L (ref 3.5–5.2)
Sodium: 140 mmol/L (ref 134–144)
Total Protein: 7.1 g/dL (ref 6.0–8.5)
eGFR: 68 mL/min/{1.73_m2} (ref >59)

## 2022-11-16 LAB — LIPID PANEL
Chol/HDL Ratio: 3.4 ratio (ref 0.0–4.4)
Cholesterol, Total: 190 mg/dL (ref 100–199)
HDL: 56 mg/dL (ref >39)
LDL Chol Calc (NIH): 124 mg/dL — ABNORMAL HIGH (ref 0–99)
Triglycerides: 53 mg/dL (ref 0–149)
VLDL Cholesterol Cal: 10 mg/dL (ref 5–40)

## 2022-11-16 LAB — CBC WITH DIFFERENTIAL/PLATELET
Basophils Absolute: 0 10*3/uL (ref 0.0–0.2)
Basos: 1 %
EOS (ABSOLUTE): 0.2 10*3/uL (ref 0.0–0.4)
Eos: 3 %
Hematocrit: 42 % (ref 34.0–46.6)
Hemoglobin: 12.5 g/dL (ref 11.1–15.9)
Immature Grans (Abs): 0 10*3/uL (ref 0.0–0.1)
Immature Granulocytes: 0 %
Lymphocytes Absolute: 2.1 10*3/uL (ref 0.7–3.1)
Lymphs: 37 %
MCH: 22.9 pg — ABNORMAL LOW (ref 26.6–33.0)
MCHC: 29.8 g/dL — ABNORMAL LOW (ref 31.5–35.7)
MCV: 77 fL — ABNORMAL LOW (ref 79–97)
Monocytes Absolute: 0.4 10*3/uL (ref 0.1–0.9)
Monocytes: 8 %
Neutrophils Absolute: 2.9 10*3/uL (ref 1.4–7.0)
Neutrophils: 51 %
Platelets: 230 10*3/uL (ref 150–450)
RBC: 5.46 x10E6/uL — ABNORMAL HIGH (ref 3.77–5.28)
RDW: 14.9 % (ref 11.7–15.4)
WBC: 5.6 10*3/uL (ref 3.4–10.8)

## 2022-11-16 LAB — TSH+FREE T4
Free T4: 1.18 ng/dL (ref 0.82–1.77)
TSH: 2.27 u[IU]/mL (ref 0.450–4.500)

## 2022-11-16 LAB — HIV ANTIBODY (ROUTINE TESTING W REFLEX): HIV Screen 4th Generation wRfx: NONREACTIVE

## 2022-11-16 LAB — HEMOGLOBIN A1C
Est. average glucose Bld gHb Est-mCnc: 120 mg/dL
Hgb A1c MFr Bld: 5.8 % — ABNORMAL HIGH (ref 4.8–5.6)

## 2022-11-16 LAB — VITAMIN D 25 HYDROXY (VIT D DEFICIENCY, FRACTURES): Vit D, 25-Hydroxy: 12.6 ng/mL — ABNORMAL LOW (ref 30.0–100.0)

## 2022-11-16 LAB — HEPATITIS C ANTIBODY: Hep C Virus Ab: NONREACTIVE

## 2022-11-16 MED ORDER — VITAMIN D (ERGOCALCIFEROL) 1.25 MG (50000 UNIT) PO CAPS
50000.0000 [IU] | ORAL_CAPSULE | ORAL | 1 refills | Status: DC
Start: 2022-11-16 — End: 2023-07-24

## 2022-11-16 NOTE — Progress Notes (Signed)
Please inform the patient that a prescription for a weekly Vit D supplement is sent to her pharmacy to start taking because her vitamin D is low.  Her LDL cholesterol is slightly elevated.  I want her LDL cholesterol to be less than 100. I recommend avoiding simple carbohydrates, including cakes, sweet desserts, ice cream, soda (diet or regular), sweet tea, candies, chips, cookies, store-bought juices, alcohol in excess of 1-2 drinks a day, lemonade, artificial sweeteners, donuts, coffee creamers, and sugar-free products.  I recommend avoiding greasy, fatty foods with increased physical activity.   All other labs are stable

## 2022-11-18 LAB — FECAL OCCULT BLOOD, IMMUNOCHEMICAL: Fecal Occult Bld: NEGATIVE

## 2022-11-19 NOTE — Progress Notes (Signed)
Please inform the patient that her fecal occult blood test was negative which is of no indication of colon cancer or large polyps cannot be present

## 2022-11-21 ENCOUNTER — Ambulatory Visit (HOSPITAL_COMMUNITY)
Admission: RE | Admit: 2022-11-21 | Discharge: 2022-11-21 | Disposition: A | Discharge status: Home / Self Care | Payer: 59 | Source: Ambulatory Visit | Attending: Family Medicine | Admitting: Family Medicine

## 2022-11-21 DIAGNOSIS — Z1231 Encounter for screening mammogram for malignant neoplasm of breast: Secondary | ICD-10-CM | POA: Insufficient documentation

## 2022-12-02 ENCOUNTER — Ambulatory Visit (HOSPITAL_COMMUNITY): Payer: 59

## 2022-12-25 IMAGING — CT CT RENAL STONE PROTOCOL
2 of 4 series · 15 of 46 positions shown, 17 images · non-contrast
Comparison: [HOSPITAL] [facility] chest and abdominal radiographs
3132 hours today. Report of CT Abdomen and Pelvis 06/05/2018 and
01/04/2013 (no images available).

CLINICAL DATA: 50-year-old female with left flank pain for 1 month.



[Series 2: axial st · axial · 0.98mm/px · z∈[+1021,+1416]mm · 12 of 89 slices shown, 14 images]
[im 5/89  soft-tissue]
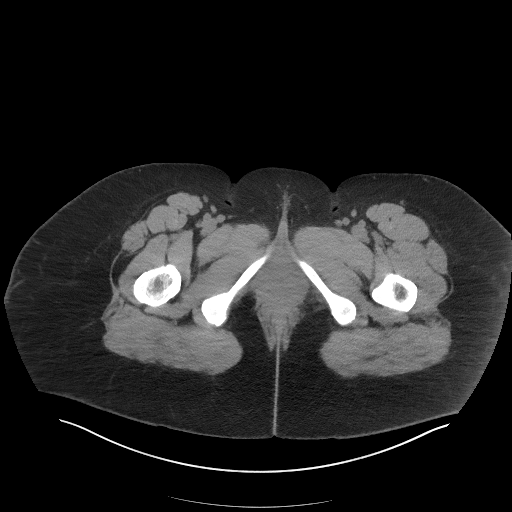
[im 5/89  bone]
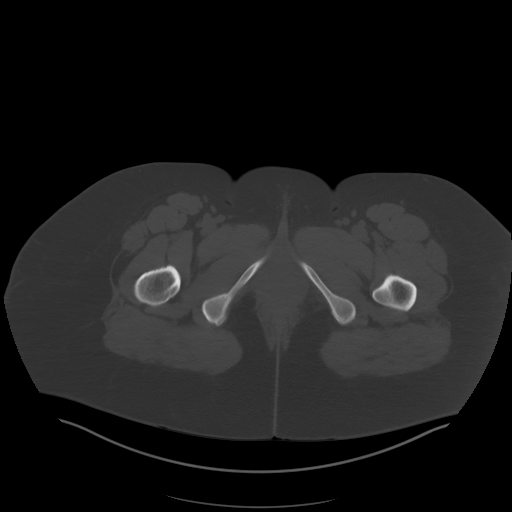
[im 15/89  soft-tissue]
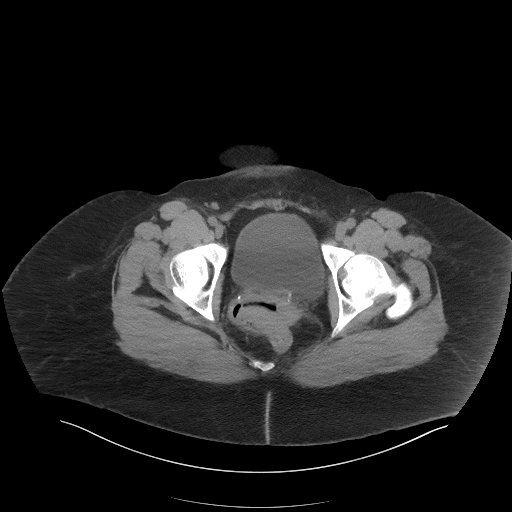
[im 20/89  soft-tissue]
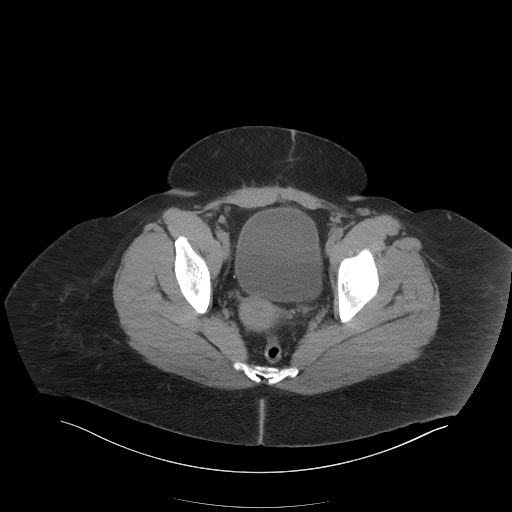
[im 25/89  soft-tissue]
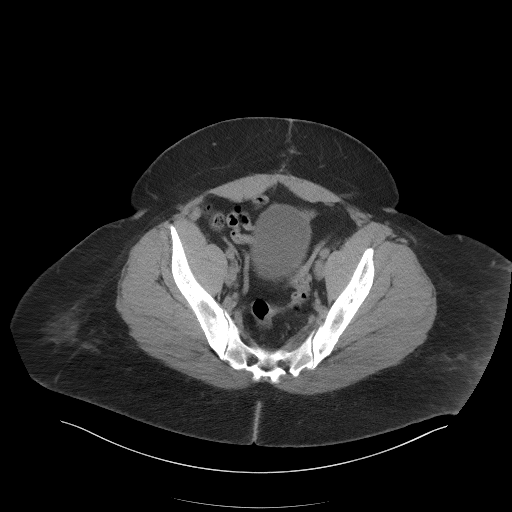
[im 35/89  soft-tissue]
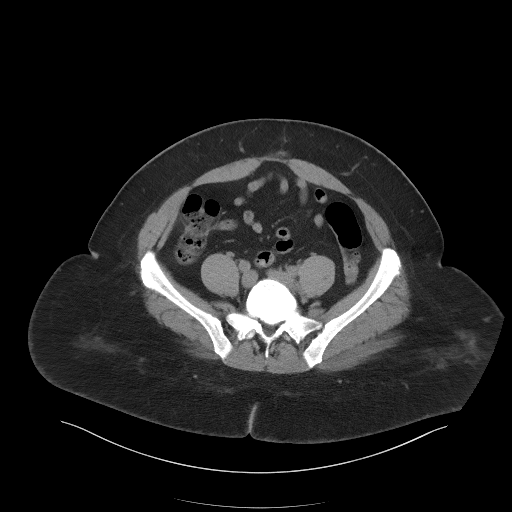
[im 40/89  soft-tissue]
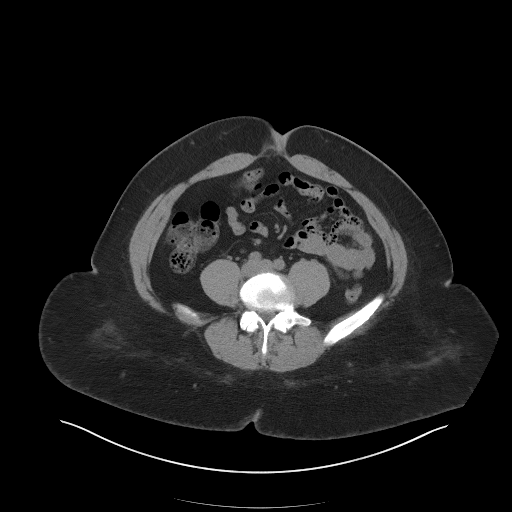
[im 49/89  soft-tissue]
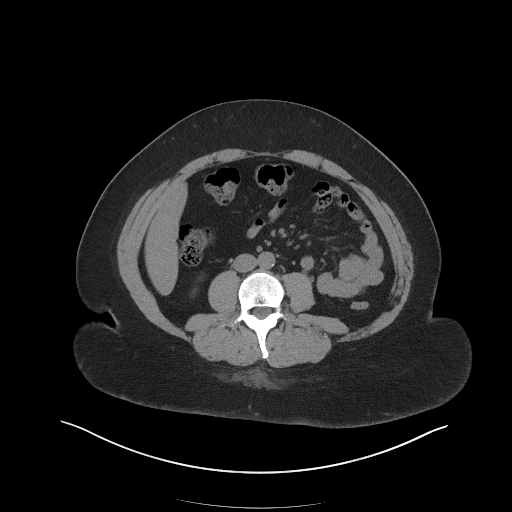
[im 54/89  soft-tissue]
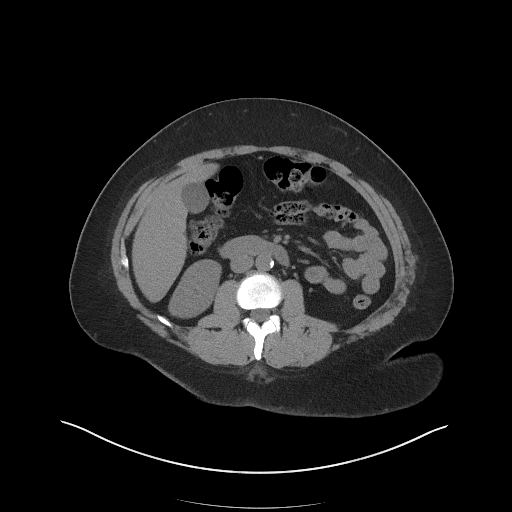
[im 64/89  soft-tissue]
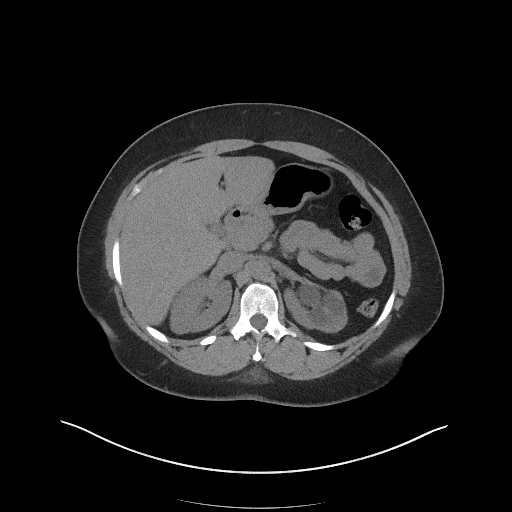
[im 64/89  bone]
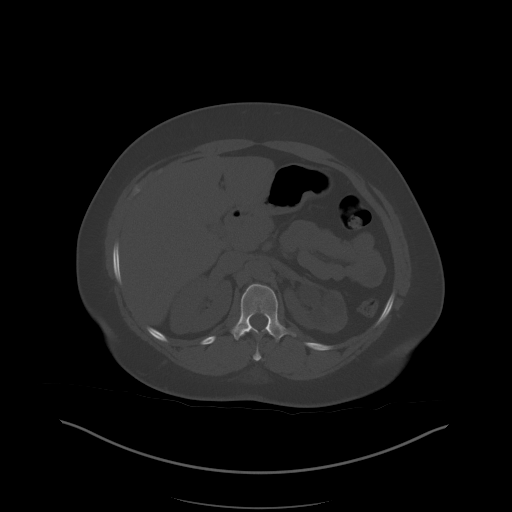
[im 69/89  soft-tissue]
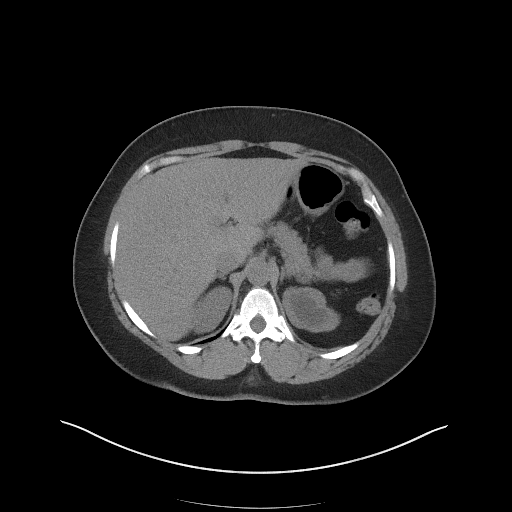
[im 74/89  soft-tissue]
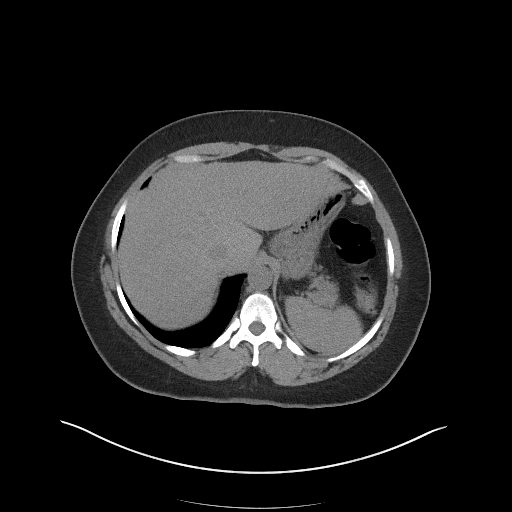
[im 84/89  soft-tissue]
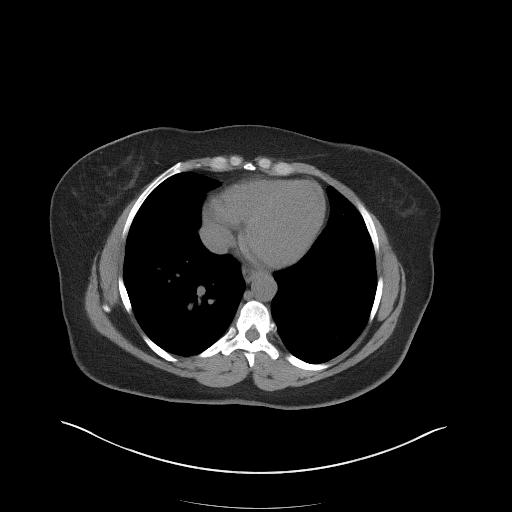

[Series 5: coronal st · coronal · 0.75mm/px · 3 of 85 slices shown]
[im 29/85  soft-tissue]
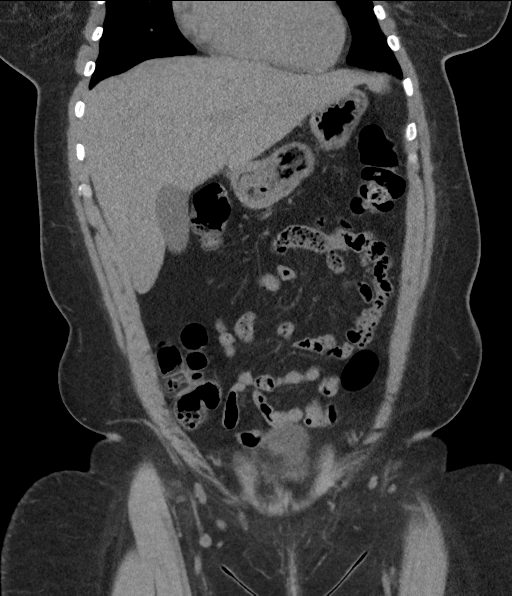
[im 38/85  soft-tissue]
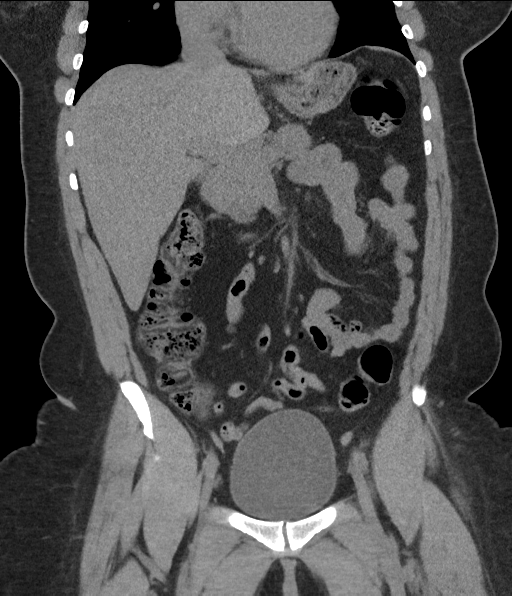
[im 47/85  soft-tissue]
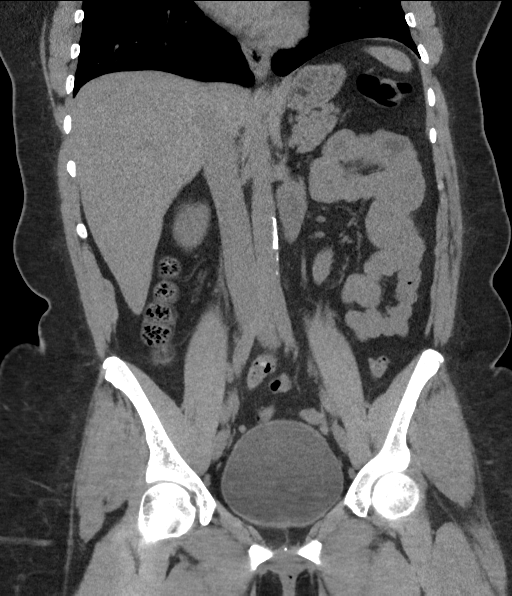

[15 of 46 positions shown; findings below may reference images not displayed]

FINDINGS: Lower chest: Negative.

Hepatobiliary: Negative noncontrast liver and gallbladder.

Pancreas: Negative.

Spleen: Negative.

Adrenals/Urinary Tract: Normal adrenal glands.

Chronic left renal cortical atrophy and dilatation of the collecting
system has been reported on multiple prior CTs since at least 5773.
And there is evidence of chronic or congenital UPJ stenosis
previously described redemonstrated on coronal image 56. no left
nephrolithiasis or pararenal inflammation.

Comparable a normal noncontrast appearance of the right kidney.
Nondilated right ureter. Both ureters appear negative to the
bladder. Bladder is moderately distended but otherwise unremarkable.
Pelvic phleboliths but no convincing urinary calculus.

Stomach/Bowel: Mild retained stool in the large bowel. No large
bowel inflammation. Elongated but normal appendix terminating in the
right hemipelvis near coronal image 55. Negative terminal ileum. No
dilated small bowel. Stomach and duodenum appear negative. No free
air, free fluid, or mesenteric inflammation.

Chronic postoperative changes to the midline ventral lower abdominal
wall with mild rectus muscle diastasis beneath the umbilicus.

Vascular/Lymphatic: Normal caliber abdominal aorta. Mild to moderate
for age Calcified aortic atherosclerosis. No lymphadenopathy
identified.

Reproductive: Negative noncontrast appearance; some gas within the
vagina.

Other: No pelvic free fluid.  Multiple pelvic phleboliths.

Musculoskeletal: Chronic severe disc and endplate degeneration at
the lumbosacral junction. No acute osseous abnormality identified.
IMPRESSION: 1. Chronic left renal atrophy and hydronephrosis with UPJ stenosis
has been described on prior CTs (no images available) since at least
5773. No urinary calculus or acute obstructive uropathy.
2. No acute or inflammatory process identified in the non-contrast
abdomen or pelvis. Normal appendix.
3. Aortic Atherosclerosis ([id number]).

## 2022-12-26 ENCOUNTER — Ambulatory Visit: Payer: Self-pay | Admitting: Internal Medicine

## 2022-12-29 ENCOUNTER — Other Ambulatory Visit: Payer: Self-pay | Admitting: Internal Medicine

## 2022-12-29 ENCOUNTER — Encounter: Payer: Self-pay | Admitting: Internal Medicine

## 2022-12-29 ENCOUNTER — Ambulatory Visit: Payer: MEDICAID | Attending: Internal Medicine | Admitting: Internal Medicine

## 2022-12-29 ENCOUNTER — Other Ambulatory Visit (INDEPENDENT_AMBULATORY_CARE_PROVIDER_SITE_OTHER): Payer: Medicaid Other

## 2022-12-29 VITALS — BP 118/78 | HR 46 | Ht 66.75 in | Wt 247.6 lb

## 2022-12-29 DIAGNOSIS — R001 Bradycardia, unspecified: Secondary | ICD-10-CM

## 2022-12-29 DIAGNOSIS — R42 Dizziness and giddiness: Secondary | ICD-10-CM

## 2022-12-29 MED ORDER — MECLIZINE HCL 25 MG PO TABS: 25.0000 mg | ORAL_TABLET | Freq: Three times a day (TID) | ORAL | 1 refills | Status: AC | PRN

## 2022-12-29 NOTE — Patient Instructions (Signed)
Medication Instructions:  Your physician has recommended you make the following change in your medication:  Start taking Meclizine 25 mg three times a day Continue taking all other medications as prescribed  Labwork: None  Testing/Procedures: Your physician has requested that you have an echocardiogram. Echocardiography is a painless test that uses sound waves to create images of your heart. It provides your doctor with information about the size and shape of your heart and how well your heart's chambers and valves are working. This procedure takes approximately one hour. There are no restrictions for this procedure. Please do NOT wear cologne, perfume, aftershave, or lotions (deodorant is allowed). Please arrive 15 minutes prior to your appointment time.  Your physician has requested that you have an exercise tolerance test. For further information please visit [email or web address] Please also follow instruction sheet, as given.  Your physician has recommended that you wear a Zio monitor.   This monitor is a medical device that records the heart's electrical activity. Doctors most often use these monitors to diagnose arrhythmias. Arrhythmias are problems with the speed or rhythm of the heartbeat. The monitor is a small device applied to your chest. You can wear one while you do your normal daily activities. While wearing this monitor if you have any symptoms to push the button and record what you felt. Once you have worn this monitor for the period of time provider prescribed (for 14 days), you will return the monitor device in the postage paid box. Once it is returned they will download the data collected and provide [place] with a report which the provider will then review and we will call you with those results. Important tips:  Avoid showering during the first 48 hours of wearing the monitor. Avoid excessive sweating to help maximize wear time. Do not submerge the device, no hot tubs, and no  swimming pools. Keep any lotions or oils away from the patch. After 48 hours you may shower with the patch on. Take brief showers with your back facing the shower head.  Do not remove patch once it has been placed because that will interrupt data and decrease adhesive wear time. Push the button when you have any symptoms and write down what you were feeling. Once you have completed wearing your monitor, remove and place into box which has postage paid and place in your outgoing mailbox.  If for some reason you have misplaced your box then call our office and we can provide another box and/or mail it off for you.   Follow-Up: Your physician recommends that you schedule a follow-up appointment in:   Any Other Special Instructions Will Be Listed Below (If Applicable).  If you need a refill on your cardiac medications before your next appointment, please call your pharmacy.

## 2022-12-29 NOTE — Progress Notes (Signed)
Cardiology Office Note  Date: 12/29/2022   ID: Yvonne Cobb, DOB 11/06/1970, MRN [id number]  PCP:  Gilmore Laroche, FNP  Cardiologist:  Marjo Bicker, MD Electrophysiologist:  None    History of Present Illness: Yvonne Cobb is a 52 y.o. female known to have fibromyalgia, bradycardia was referred to cardiology clinic for evaluation of symptomatic bradycardia.  Patient was recently made aware of her low heart rate, HR 40 to 50s. Ongoing intermittent dizziness for the last 6 years, occurring few times per week, last for 10 to 15 minutes.  Describes dizziness as room spinning sensation and sometimes describes dizziness as lightheadedness.  Never took meclizine.  She also has exertional fatigue.  Sometimes has chest pressure/chest pain occurring few times per week.  No orthopnea, PND.  Some SOB.  No syncope.  Has some leg swelling for which her PCP prescribed Lasix in the past.  She underwent LHC in 2016 which showed angiographically normal coronaries.  She had a false positive NM stress test.  Past Medical History:  Diagnosis Date   Abnormal stress test 04/10/2015   Asthma    Patient states "asthmatic bronchitis"   Chronic kidney disease    per patient, 75% right kidney function, 25% left kidney function   Depression    FH: CAD (coronary artery disease) 04/10/2015   Fibromyalgia    GERD (gastroesophageal reflux disease)     Past Surgical History:  Procedure Laterality Date   ABDOMINAL HYSTERECTOMY  2006   partial; left tube and ovary removal   CARDIAC CATHETERIZATION N/A 04/10/2015   Procedure: Left Heart Cath and Coronary Angiography;  Surgeon: Lyn Records, MD;  Location: [facility] INVASIVE CV LAB;  Service: Cardiovascular;  Laterality: N/A;   CESAREAN SECTION     ESOPHAGOGASTRODUODENOSCOPY     KIDNEY SURGERY  1990-2006   Patient states that she has had at least 10 surgeries on her left kidney   TONSILLECTOMY     TUBAL LIGATION  1997    Current Outpatient  Medications  Medication Sig Dispense Refill   albuterol (VENTOLIN HFA) 108 (90 Base) MCG/ACT inhaler Inhale into the lungs every 6 (six) hours as needed for wheezing or shortness of breath.     esomeprazole (NEXIUM) 20 MG capsule Take 20 mg by mouth daily at 12 noon.     meclizine (ANTIVERT) 25 MG tablet Take 1 tablet (25 mg total) by mouth 3 (three) times daily as needed for dizziness. 270 tablet 1   Vitamin D, Ergocalciferol, (DRISDOL) 1.25 MG (50000 UNIT) CAPS capsule Take 1 capsule (50,000 Units total) by mouth every 7 (seven) days. 20 capsule 1   No current facility-administered medications for this visit.   Allergies:  Hydrocodone and Tape   Social History: The patient  reports that she quit smoking about 9 years ago. Her smoking use included cigars. She has never used smokeless tobacco. She reports that she does not drink alcohol and does not use drugs.   Family History: The patient's family history includes CAD in her mother; Diabetes in her father, mother, sister, and sister; Heart attack in her mother; Hypertension in her mother; Kidney disease in her father; Lung cancer in her father.   ROS:  Please see the history of present illness. Otherwise, complete review of systems is positive for none  All other systems are reviewed and negative.   Physical Exam: VS:  BP 118/78   Pulse (!) 46   Ht 5' 6.75" (1.695 m)   Wt 247  lb 9.6 oz (112.3 kg)   SpO2 98%   BMI 39.07 kg/m , BMI Body mass index is 39.07 kg/m.  Wt Readings from Last 3 Encounters:  12/29/22 247 lb 9.6 oz (112.3 kg)  11/15/22 237 lb 1.3 oz (107.5 kg)  10/28/22 230 lb (104.3 kg)    General: Patient appears comfortable at rest. HEENT: Conjunctiva and lids normal, oropharynx clear with moist mucosa. Neck: Supple, no elevated JVP or carotid bruits, no thyromegaly. Lungs: Clear to auscultation, nonlabored breathing at rest. Cardiac: Regular rate and rhythm, no S3 or significant systolic murmur, no pericardial  rub. Abdomen: Soft, nontender, no hepatomegaly, bowel sounds present, no guarding or rebound. Extremities: No pitting edema, distal pulses 2+. Skin: Warm and dry. Musculoskeletal: No kyphosis. Neuropsychiatric: Alert and oriented x3, affect grossly appropriate.  Recent Labwork: 11/15/2022: ALT 15; AST 12; BUN 16; Creatinine, Ser 1.01; Hemoglobin 12.5; Platelets 230; Potassium 4.8; Sodium 140; TSH 2.270     Component Value Date/Time   CHOL 190 11/15/2022 1436   TRIG 53 11/15/2022 1436   HDL 56 11/15/2022 1436   CHOLHDL 3.4 11/15/2022 1436   CHOLHDL 3.8 04/10/2015 0320   VLDL 8 04/10/2015 0320   LDLCALC 124 (H) 11/15/2022 1436    Assessment and Plan:  # Severe sinus bradycardia (HR 40 to 50s) -Patient was recently made aware about her low HR. Ongoing intermittent dizziness x 6 years occurring few times per week and lasting for about 10 to 15 minutes. She describes dizziness as room spinning sensation and sometimes is lightheadedness. Will prescribe meclizine 25 mg TID PRN to evaluate for improvement in vertigo.  She also has fatigue with exertion. Will obtain exercise tolerance test to assess for chronotropic incompetence. Obtain 2D echocardiogram to rule out structural heart disease.  Obtain 2-week event monitor,live to rule out conduction system abnormalities. -Does not snore and no sleep apneas.  No insomnia, sleeps good. No OSA symptoms.  Normal TSH.  # Noncardiac chest pain # False positive NM stress test, angiographically normal coronaries in 2016   I have spent a total of 45 minutes with patient reviewing chart, EKGs, labs and examining patient as well as establishing an assessment and plan that was discussed with the patient.  > 50% of time was spent in direct patient care.    Medication Adjustments/Labs and Tests Ordered: Current medicines are reviewed at length with the patient today.  Concerns regarding medicines are outlined above.   Tests Ordered: Orders Placed This  Encounter  Procedures   EXERCISE TOLERANCE TEST (ETT)   EKG 12-Lead   ECHOCARDIOGRAM COMPLETE    Medication Changes: Meds ordered this encounter  Medications   meclizine (ANTIVERT) 25 MG tablet    Sig: Take 1 tablet (25 mg total) by mouth 3 (three) times daily as needed for dizziness.    Dispense:  270 tablet    Refill:  1    12/29/2022-New    Disposition:  Follow up  1 year  Signed Georjean Toya Verne Spurr, MD, 12/29/2022 11:12 AM    [facility] [address] [place], [place], [place] [address]

## 2023-01-11 ENCOUNTER — Telehealth (HOSPITAL_COMMUNITY): Payer: Self-pay | Admitting: Emergency Medicine

## 2023-01-11 NOTE — Telephone Encounter (Signed)
pt verbalized understanding of test

## 2023-01-12 ENCOUNTER — Other Ambulatory Visit: Payer: Self-pay

## 2023-01-12 DIAGNOSIS — R42 Dizziness and giddiness: Secondary | ICD-10-CM

## 2023-01-13 ENCOUNTER — Ambulatory Visit (HOSPITAL_COMMUNITY)
Admission: RE | Admit: 2023-01-13 | Discharge: 2023-01-13 | Disposition: A | Discharge status: Home / Self Care | Payer: Medicaid Other | Source: Ambulatory Visit | Attending: Internal Medicine | Admitting: Internal Medicine

## 2023-01-13 DIAGNOSIS — R001 Bradycardia, unspecified: Secondary | ICD-10-CM

## 2023-01-13 DIAGNOSIS — R42 Dizziness and giddiness: Secondary | ICD-10-CM | POA: Diagnosis not present

## 2023-01-13 LAB — EXERCISE TOLERANCE TEST
Angina Index: 0
Duke Treadmill Score: 6
Estimated workload: 7
Exercise duration (min): 5 min
Exercise duration (sec): 57 s
MPHR: 171 {beats}/min
Peak HR: 141 {beats}/min
Percent HR: 82 %
RPE: 15
Rest HR: 57 {beats}/min
ST Depression (mm): 0 mm

## 2023-01-17 ENCOUNTER — Telehealth: Payer: Self-pay | Admitting: Family Medicine

## 2023-01-17 ENCOUNTER — Other Ambulatory Visit: Payer: Self-pay

## 2023-01-17 NOTE — Telephone Encounter (Signed)
Patient calling says she has been exposed to Covid and is beginning to have symptoms. Wanting to speak with a nurse regarding next step. Please advise Thank you

## 2023-01-17 NOTE — Telephone Encounter (Signed)
Pt has an appt scheduled 08/21.

## 2023-01-18 ENCOUNTER — Telehealth (INDEPENDENT_AMBULATORY_CARE_PROVIDER_SITE_OTHER): Payer: Medicaid Other | Admitting: Family Medicine

## 2023-01-18 ENCOUNTER — Encounter: Payer: Self-pay | Admitting: Family Medicine

## 2023-01-18 VITALS — Ht 66.0 in | Wt 243.0 lb

## 2023-01-18 DIAGNOSIS — Z20822 Contact with and (suspected) exposure to covid-19: Secondary | ICD-10-CM

## 2023-01-18 MED ORDER — PROMETHAZINE-DM 6.25-15 MG/5ML PO SYRP: 5.0000 mL | ORAL_SOLUTION | Freq: Four times a day (QID) | ORAL | Status: DC | PRN

## 2023-01-18 MED ORDER — PROMETHAZINE-DM 6.25-15 MG/5ML PO SYRP
1.2500 mL | ORAL_SOLUTION | Freq: Four times a day (QID) | ORAL | 0 refills | Status: DC | PRN
Start: 2023-01-18 — End: 2023-01-18

## 2023-01-18 NOTE — Progress Notes (Signed)
   Virtual Visit via Video Note  I connected with Yvonne Cobb on 01/18/23 at 11:00 AM EDT by a video enabled telemedicine application and verified that I am speaking with the correct person using two identifiers.  Patient Location: Home Provider Location: Office/Clinic  I discussed the limitations, risks, security, and privacy concerns of performing an evaluation and management service by video and the availability of in person appointments. I also discussed with the patient that there may be a patient responsible charge related to this service. The patient expressed understanding and agreed to proceed.  Subjective: PCP: Gilmore Laroche, FNP  Chief Complaint  Patient presents with   Covid Exposure    Pt reports sx of sore throat and coughing, body aches and chills started Sunday night (01/15/23)   HPI  Recently was exposed to covid on saturaday on 01/14/2023. She c/o body aches, sore throat, and dry cough.  She reports taking vitamin C and zinc Tylenol as needed for body aches.  No fever, trouble breathing, loss of sense of smell or taste reported   ROS: Per HPI  Current Outpatient Medications:    albuterol (VENTOLIN HFA) 108 (90 Base) MCG/ACT inhaler, Inhale into the lungs every 6 (six) hours as needed for wheezing or shortness of breath., Disp: , Rfl:    esomeprazole (NEXIUM) 20 MG capsule, Take 20 mg by mouth daily at 12 noon., Disp: , Rfl:    meclizine (ANTIVERT) 25 MG tablet, Take 1 tablet (25 mg total) by mouth 3 (three) times daily as needed for dizziness., Disp: 270 tablet, Rfl: 1   Vitamin D, Ergocalciferol, (DRISDOL) 1.25 MG (50000 UNIT) CAPS capsule, Take 1 capsule (50,000 Units total) by mouth every 7 (seven) days., Disp: 20 capsule, Rfl: 1   promethazine-dextromethorphan (PROMETHAZINE-DM) 6.25-15 MG/5ML syrup, Take 5 mLs by mouth 4 (four) times daily as needed for cough., Disp: , Rfl:   Observations/Objective: Today's Vitals   01/18/23 1048  Weight: 243 lb (110.2  kg)  Height: 5\' 6"  (1.676 m)  PainSc: 7   PainLoc: Generalized   Physical Exam  Assessment and Plan: Close exposure to COVID-19 virus -     COVID-19, Flu A+B and RSV -     Promethazine-DM; Take 5 mLs by mouth 4 (four) times daily as needed for cough.  Encouraged supportive treatment with rest, increase hydration, steam humidifier, Tylenol as needed for body aches and headaches and warm salt gargles 3-4 times daily for sore throat.  Encouraged isolation from others to prevent the spread of the virus Follow Up Instructions: No follow-ups on file.   I discussed the assessment and treatment plan with the patient. The patient was provided an opportunity to ask questions, and all were answered. The patient agreed with the plan and demonstrated an understanding of the instructions.   The patient was advised to call back or seek an in-person evaluation if the symptoms worsen or if the condition fails to improve as anticipated.  The above assessment and management plan was discussed with the patient. The patient verbalized understanding of and has agreed to the management plan.   Gilmore Laroche, FNP

## 2023-01-20 ENCOUNTER — Other Ambulatory Visit: Payer: Self-pay | Admitting: Family Medicine

## 2023-01-20 DIAGNOSIS — Z20822 Contact with and (suspected) exposure to covid-19: Secondary | ICD-10-CM

## 2023-01-20 LAB — COVID-19, FLU A+B AND RSV
Influenza A, NAA: NOT DETECTED
Influenza B, NAA: NOT DETECTED
RSV, NAA: NOT DETECTED
SARS-CoV-2, NAA: DETECTED — AB

## 2023-01-20 MED ORDER — NIRMATRELVIR/RITONAVIR (PAXLOVID)TABLET
3.0000 | ORAL_TABLET | Freq: Two times a day (BID) | ORAL | 0 refills | Status: AC
Start: 2023-01-20 — End: 2023-01-25

## 2023-01-25 ENCOUNTER — Ambulatory Visit: Payer: MEDICAID | Attending: Internal Medicine

## 2023-01-25 DIAGNOSIS — R42 Dizziness and giddiness: Secondary | ICD-10-CM | POA: Diagnosis not present

## 2023-01-26 LAB — ECHOCARDIOGRAM COMPLETE
AR max vel: 1.52 cm2
AV Area VTI: 1.7 cm2
AV Area mean vel: 1.52 cm2
AV Mean grad: 7 mmHg
AV Peak grad: 12.4 mmHg
Ao pk vel: 1.76 m/s
Area-P 1/2: 3.02 cm2
Calc EF: 58.7 %
MV VTI: 2.4 cm2
S' Lateral: 3.4 cm
Single Plane A2C EF: 45.1 %
Single Plane A4C EF: 64.4 %

## 2023-02-15 ENCOUNTER — Encounter: Payer: Self-pay | Admitting: Family Medicine

## 2023-02-15 ENCOUNTER — Ambulatory Visit: Payer: Medicaid Other | Admitting: Family Medicine

## 2023-02-15 VITALS — BP 111/80 | HR 70 | Ht 66.75 in | Wt 251.1 lb

## 2023-02-15 DIAGNOSIS — Z6839 Body mass index (BMI) 39.0-39.9, adult: Secondary | ICD-10-CM

## 2023-02-15 DIAGNOSIS — E038 Other specified hypothyroidism: Secondary | ICD-10-CM

## 2023-02-15 DIAGNOSIS — Z23 Encounter for immunization: Secondary | ICD-10-CM

## 2023-02-15 DIAGNOSIS — J4520 Mild intermittent asthma, uncomplicated: Secondary | ICD-10-CM

## 2023-02-15 DIAGNOSIS — E559 Vitamin D deficiency, unspecified: Secondary | ICD-10-CM

## 2023-02-15 DIAGNOSIS — G4700 Insomnia, unspecified: Secondary | ICD-10-CM

## 2023-02-15 DIAGNOSIS — R7301 Impaired fasting glucose: Secondary | ICD-10-CM | POA: Diagnosis not present

## 2023-02-15 DIAGNOSIS — G479 Sleep disorder, unspecified: Secondary | ICD-10-CM

## 2023-02-15 DIAGNOSIS — E7849 Other hyperlipidemia: Secondary | ICD-10-CM

## 2023-02-15 MED ORDER — BUDESONIDE-FORMOTEROL FUMARATE 160-4.5 MCG/ACT IN AERO: 2.0000 | INHALATION_SPRAY | Freq: Two times a day (BID) | RESPIRATORY_TRACT | 3 refills | Status: DC

## 2023-02-15 MED ORDER — HYDROXYZINE PAMOATE 25 MG PO CAPS
25.0000 mg | ORAL_CAPSULE | Freq: Every evening | ORAL | 0 refills | Status: DC | PRN
Start: 2023-02-15 — End: 2023-07-24

## 2023-02-15 MED ORDER — HYDROXYZINE PAMOATE 25 MG PO CAPS
25.0000 mg | ORAL_CAPSULE | Freq: Three times a day (TID) | ORAL | 0 refills | Status: DC | PRN
Start: 2023-02-15 — End: 2023-02-15

## 2023-02-15 MED ORDER — ALBUTEROL SULFATE HFA 108 (90 BASE) MCG/ACT IN AERS: 1.0000 | INHALATION_SPRAY | Freq: Four times a day (QID) | RESPIRATORY_TRACT | 1 refills | Status: DC | PRN

## 2023-02-15 NOTE — Progress Notes (Signed)
Established Patient Office Visit  Subjective:  Patient ID: Yvonne Cobb, female    DOB: 11-17-1970  Age: 52 y.o. MRN: [id number]  CC:  Chief Complaint  Patient presents with   Care Management    3 month f/u   Obesity    Pt reports weight concerns would like to discuss options.   Insomnia    Pt reports difficulty sleeping, ongoing for 3-4 weeks. Racing mind trouble sleeping.    HPI Yvonne Cobb is a 52 y.o. female presents for f/u of  chronic medical conditions.  Obesity:The patient expresses a desire to discuss weight loss options, as she has been attempting to lose weight with minimal changes observed. She admits to having a poor dietary intake but exercises at least twice weekly.  Insomnia:The patient reports difficulty sleeping, which has been ongoing for about 3 to 4 weeks. She notes that her mind races at night, preventing her from falling asleep.    Past Medical History:  Diagnosis Date   Abnormal stress test 04/10/2015   Asthma    Patient states "asthmatic bronchitis"   Chronic kidney disease    per patient, 75% right kidney function, 25% left kidney function   Depression    FH: CAD (coronary artery disease) 04/10/2015   Fibromyalgia    GERD (gastroesophageal reflux disease)     Past Surgical History:  Procedure Laterality Date   ABDOMINAL HYSTERECTOMY  2006   partial; left tube and ovary removal   CARDIAC CATHETERIZATION N/A 04/10/2015   Procedure: Left Heart Cath and Coronary Angiography;  Surgeon: Lyn Records, MD;  Location: [facility] INVASIVE CV LAB;  Service: Cardiovascular;  Laterality: N/A;   CESAREAN SECTION     ESOPHAGOGASTRODUODENOSCOPY     KIDNEY SURGERY  1990-2006   Patient states that she has had at least 10 surgeries on her left kidney   TONSILLECTOMY     TUBAL LIGATION  1997    Family History  Problem Relation Age of Onset   CAD Mother    Diabetes Mother    Hypertension Mother    Heart attack Mother    Lung cancer Father     Diabetes Father    Kidney disease Father    Diabetes Sister    Diabetes Sister     Social History   Socioeconomic History   Marital status: Divorced    Spouse name: Not on file   Number of children: Not on file   Years of education: Not on file   Highest education level: Some college, no degree  Occupational History   Not on file  Tobacco Use   Smoking status: Former    Types: Cigars    Quit date: 05/29/2013    Years since quitting: 9.7   Smokeless tobacco: Never  Vaping Use   Vaping status: Never Used  Substance and Sexual Activity   Alcohol use: No   Drug use: No   Sexual activity: Not on file  Other Topics Concern   Not on file  Social History Narrative   Not on file   Social Determinants of Health   Financial Resource Strain: Low Risk  (02/14/2023)   Overall Financial Resource Strain (CARDIA)    Difficulty of Paying Living Expenses: Not very hard  Food Insecurity: Food Insecurity Present (02/14/2023)   Hunger Vital Sign    Worried About Running Out of Food in the Last Year: Sometimes true    Ran Out of Food in the Last Year: Never true  Transportation  Needs: No Transportation Needs (02/14/2023)   PRAPARE - Administrator, Civil Service (Medical): No    Lack of Transportation (Non-Medical): No  Physical Activity: Insufficiently Active (02/14/2023)   Exercise Vital Sign    Days of Exercise per Week: 2 days    Minutes of Exercise per Session: 40 min  Stress: Stress Concern Present (02/14/2023)   [employer] of Occupational Health - Occupational Stress Questionnaire    Feeling of Stress : To some extent  Social Connections: Moderately Isolated (02/14/2023)   Social Connection and Isolation Panel [NHANES]    Frequency of Communication with Friends and Family: More than three times a week    Frequency of Social Gatherings with Friends and Family: Twice a week    Attends Religious Services: 1 to 4 times per year    Active Member of [employer] or  Organizations: No    Attends Engineer, structural: Not on file    Marital Status: Divorced  Catering manager Violence: Not on file    Outpatient Medications Prior to Visit  Medication Sig Dispense Refill   esomeprazole (NEXIUM) 20 MG capsule Take 20 mg by mouth daily at 12 noon.     meclizine (ANTIVERT) 25 MG tablet Take 1 tablet (25 mg total) by mouth 3 (three) times daily as needed for dizziness. 270 tablet 1   promethazine-dextromethorphan (PROMETHAZINE-DM) 6.25-15 MG/5ML syrup Take 5 mLs by mouth 4 (four) times daily as needed for cough.     Vitamin D, Ergocalciferol, (DRISDOL) 1.25 MG (50000 UNIT) CAPS capsule Take 1 capsule (50,000 Units total) by mouth every 7 (seven) days. 20 capsule 1   albuterol (VENTOLIN HFA) 108 (90 Base) MCG/ACT inhaler Inhale into the lungs every 6 (six) hours as needed for wheezing or shortness of breath.     No facility-administered medications prior to visit.    Allergies  Allergen Reactions   Hydrocodone Itching    Hives/itching   Tape Itching    "paper tape"    ROS Review of Systems  Constitutional:  Negative for chills and fever.  Eyes:  Negative for visual disturbance.  Respiratory:  Negative for chest tightness and shortness of breath.   Neurological:  Negative for dizziness and headaches.      Objective:    Physical Exam Constitutional:      Appearance: She is obese.  HENT:     Head: Normocephalic.     Mouth/Throat:     Mouth: Mucous membranes are moist.  Cardiovascular:     Rate and Rhythm: Normal rate.     Heart sounds: Normal heart sounds.  Pulmonary:     Effort: Pulmonary effort is normal.     Breath sounds: Normal breath sounds.  Neurological:     Mental Status: She is alert.     BP 111/80   Pulse 70   Ht 5' 6.75" (1.695 m)   Wt 251 lb 1.3 oz (113.9 kg)   SpO2 98%   BMI 39.62 kg/m  Wt Readings from Last 3 Encounters:  02/15/23 251 lb 1.3 oz (113.9 kg)  01/18/23 243 lb (110.2 kg)  12/29/22 247 lb 9.6  oz (112.3 kg)    Lab Results  Component Value Date   TSH 2.270 11/15/2022   Lab Results  Component Value Date   WBC 5.6 11/15/2022   HGB 12.5 11/15/2022   HCT 42.0 11/15/2022   MCV 77 (L) 11/15/2022   PLT 230 11/15/2022   Lab Results  Component Value Date  NA 140 11/15/2022   K 4.8 11/15/2022   CO2 25 11/15/2022   GLUCOSE 91 11/15/2022   BUN 16 11/15/2022   CREATININE 1.01 (H) 11/15/2022   BILITOT 0.4 11/15/2022   ALKPHOS 100 11/15/2022   AST 12 11/15/2022   ALT 15 11/15/2022   PROT 7.1 11/15/2022   ALBUMIN 4.3 11/15/2022   CALCIUM 9.8 11/15/2022   ANIONGAP 9 10/28/2022   EGFR 68 11/15/2022   Lab Results  Component Value Date   CHOL 190 11/15/2022   Lab Results  Component Value Date   HDL 56 11/15/2022   Lab Results  Component Value Date   LDLCALC 124 (H) 11/15/2022   Lab Results  Component Value Date   TRIG 53 11/15/2022   Lab Results  Component Value Date   CHOLHDL 3.4 11/15/2022   Lab Results  Component Value Date   HGBA1C 5.8 (H) 11/15/2022      Assessment & Plan:  Class 2 severe obesity due to excess calories with serious comorbidity and body mass index (BMI) of 39.0 to 39.9 in adult [facility]) Assessment & Plan: Recommendations for Weight Loss Management:  Emphasize Lifestyle Changes: A heart-healthy diet and increased physical activity are crucial. Healthy Tips for Weight Loss: Increase Intake of Nutrient-Rich Foods: Prioritize fruits, vegetables, and whole grains. Incorporate Lean Proteins: Include chicken, fish, beans, and legumes in your diet. Choose Low-Fat Dairy Products: Opt for dairy products that are low in fat. Reduce Unhealthy Fats: Limit saturated fats, trans fatty acids, and cholesterol. Aim for Regular Physical Activity: Engage in at least 30 minutes of brisk walking or other physical activities on at least 5 days a week.    Insomnia, unspecified type Assessment & Plan: start taking hydroxyzine 25 mg nightly as needed Please  follow these non-pharmacological interventions to ensure adequate sleep hygiene practices: Establish a Bedtime Routine: Create a consistent routine before bed to signal to your body that it is time to wind down. Maintain a Regular Sleep-Wake Schedule: Go to bed and wake up at the same time every day, even on weekends. Avoid Electronics Before Bed: Limit the use of computers and other electronic devices at least one hour before bedtime to reduce blue light exposure. Limit Time in Bed: If unable to fall asleep within 15 minutes, get out of bed and engage in a relaxing activity before trying again. Manage Daily Stress: Incorporate stress-reducing activities into your daily routine to alleviate anxiety and tension. Avoid Late Exercise: Refrain from vigorous exercise close to bedtime, as it can interfere with sleep. Use Bed for Sleep and Intimacy Only: Reserve the bed for sleep and sexual activity to strengthen the association between your bed and rest. Remove Electronics from [employer] Area: Consider keeping electronic devices away from the bedroom to minimize distractions.    IFG (impaired fasting glucose) -     Hemoglobin A1c  Vitamin D deficiency -     VITAMIN D 25 Hydroxy (Vit-D Deficiency, Fractures)  Other specified hypothyroidism -     TSH + free T4  Other hyperlipidemia -     Lipid panel -     CMP14+EGFR -     CBC with Differential/Platelet  Sleep disturbance -     hydrOXYzine Pamoate; Take 1 capsule (25 mg total) by mouth at bedtime as needed.  Dispense: 30 capsule; Refill: 0  Mild intermittent asthma without complication -     Albuterol Sulfate HFA; Inhale 1-2 puffs into the lungs every 6 (six) hours as needed for wheezing or  shortness of breath.  Dispense: 6.7 g; Refill: 1 -     Budesonide-Formoterol Fumarate; Inhale 2 puffs into the lungs 2 (two) times daily.  Dispense: 6 g; Refill: 3  Encounter for immunization -     Flu vaccine trivalent PF, 6mos and  older(Flulaval,Afluria,Fluarix,Fluzone)  Note: This chart has been completed using Engineer, civil (consulting) software, and while attempts have been made to ensure accuracy, certain words and phrases may not be transcribed as intended.    Follow-up: Return in about 1 month (around 03/17/2023).   Gilmore Laroche, FNP

## 2023-02-15 NOTE — Patient Instructions (Addendum)
I appreciate the opportunity to provide care to you today!    Follow up:  1 month  Fasting Labs: please stop by the lab today/during the week to get your blood drawn (CBC, CMP, TSH, Lipid profile, HgA1c, Vit D)  Asthma I started you on a daily maintenance inhaler Symbicort to inhale 2 puffs into the lungs twice daily   Sleep disturbances -start taking hydroxyzine 25 mg nightly as needed Please follow these non-pharmacological interventions to ensure adequate sleep hygiene practices: Establish a Bedtime Routine: Create a consistent routine before bed to signal to your body that it is time to wind down. Maintain a Regular Sleep-Wake Schedule: Go to bed and wake up at the same time every day, even on weekends. Avoid Electronics Before Bed: Limit the use of computers and other electronic devices at least one hour before bedtime to reduce blue light exposure. Limit Time in Bed: If unable to fall asleep within 15 minutes, get out of bed and engage in a relaxing activity before trying again. Manage Daily Stress: Incorporate stress-reducing activities into your daily routine to alleviate anxiety and tension. Avoid Late Exercise: Refrain from vigorous exercise close to bedtime, as it can interfere with sleep. Use Bed for Sleep and Intimacy Only: Reserve the bed for sleep and sexual activity to strengthen the association between your bed and rest. Remove Electronics from [employer] Area: Consider keeping electronic devices away from the bedroom to minimize distractions.   Recommendations for Weight Loss Management:  Emphasize Lifestyle Changes: A heart-healthy diet and increased physical activity are crucial. Healthy Tips for Weight Loss: Increase Intake of Nutrient-Rich Foods: Prioritize fruits, vegetables, and whole grains. Incorporate Lean Proteins: Include chicken, fish, beans, and legumes in your diet. Choose Low-Fat Dairy Products: Opt for dairy products that are low in fat. Reduce  Unhealthy Fats: Limit saturated fats, trans fatty acids, and cholesterol. Aim for Regular Physical Activity: Engage in at least 30 minutes of brisk walking or other physical activities on at least 5 days a week.     Please continue to a heart-healthy diet and increase your physical activities. Try to exercise for at least five days a week.    It was a pleasure to see you and I look forward to continuing to work together on your health and well-being. Please do not hesitate to call the office if you need care or have questions about your care.  In case of emergency, please visit the Emergency Department for urgent care, or contact our clinic at [phone] to schedule an appointment. We're here to help you!   Have a wonderful day and week. With Gratitude, Gilmore Laroche MSN, FNP-BC

## 2023-02-17 DIAGNOSIS — G4700 Insomnia, unspecified: Secondary | ICD-10-CM | POA: Insufficient documentation

## 2023-02-17 DIAGNOSIS — E669 Obesity, unspecified: Secondary | ICD-10-CM | POA: Insufficient documentation

## 2023-02-17 NOTE — Assessment & Plan Note (Signed)
start taking hydroxyzine 25 mg nightly as needed Please follow these non-pharmacological interventions to ensure adequate sleep hygiene practices: Establish a Bedtime Routine: Create a consistent routine before bed to signal to your body that it is time to wind down. Maintain a Regular Sleep-Wake Schedule: Go to bed and wake up at the same time every day, even on weekends. Avoid Electronics Before Bed: Limit the use of computers and other electronic devices at least one hour before bedtime to reduce blue light exposure. Limit Time in Bed: If unable to fall asleep within 15 minutes, get out of bed and engage in a relaxing activity before trying again. Manage Daily Stress: Incorporate stress-reducing activities into your daily routine to alleviate anxiety and tension. Avoid Late Exercise: Refrain from vigorous exercise close to bedtime, as it can interfere with sleep. Use Bed for Sleep and Intimacy Only: Reserve the bed for sleep and sexual activity to strengthen the association between your bed and rest. Remove Electronics from [employer] Area: Consider keeping electronic devices away from the bedroom to minimize distractions.

## 2023-02-17 NOTE — Assessment & Plan Note (Signed)
Recommendations for Weight Loss Management:  Emphasize Lifestyle Changes: A heart-healthy diet and increased physical activity are crucial. Healthy Tips for Weight Loss: Increase Intake of Nutrient-Rich Foods: Prioritize fruits, vegetables, and whole grains. Incorporate Lean Proteins: Include chicken, fish, beans, and legumes in your diet. Choose Low-Fat Dairy Products: Opt for dairy products that are low in fat. Reduce Unhealthy Fats: Limit saturated fats, trans fatty acids, and cholesterol. Aim for Regular Physical Activity: Engage in at least 30 minutes of brisk walking or other physical activities on at least 5 days a week.

## 2023-02-20 ENCOUNTER — Other Ambulatory Visit: Payer: Self-pay | Admitting: Family Medicine

## 2023-02-20 ENCOUNTER — Telehealth: Payer: Self-pay | Admitting: Family Medicine

## 2023-02-20 MED ORDER — WEGOVY 0.25 MG/0.5ML ~~LOC~~ SOAJ: 0.2500 mg | SUBCUTANEOUS | 0 refills | Status: DC

## 2023-02-20 NOTE — Telephone Encounter (Signed)
Rx sent 

## 2023-02-20 NOTE — Telephone Encounter (Signed)
Patient wanting to inform that she spoke with insurance and they do cover [facility] Rx

## 2023-02-21 LAB — CMP14+EGFR
ALT: 13 IU/L (ref 0–32)
AST: 10 IU/L (ref 0–40)
Albumin: 3.9 g/dL (ref 3.9–4.9)
Alkaline Phosphatase: 95 IU/L (ref 44–121)
BUN/Creatinine Ratio: 13 (ref 9–23)
BUN: 13 mg/dL (ref 6–24)
Bilirubin Total: 0.2 mg/dL (ref 0.0–1.2)
CO2: 22 mmol/L (ref 20–29)
Calcium: 9 mg/dL (ref 8.7–10.2)
Chloride: 105 mmol/L (ref 96–106)
Creatinine, Ser: 1.01 mg/dL — ABNORMAL HIGH (ref 0.57–1.00)
Globulin, Total: 3.1 g/dL (ref 1.5–4.5)
Glucose: 98 mg/dL (ref 70–99)
Potassium: 4.4 mmol/L (ref 3.5–5.2)
Sodium: 141 mmol/L (ref 134–144)
Total Protein: 7 g/dL (ref 6.0–8.5)
eGFR: 68 mL/min/{1.73_m2} (ref >59)

## 2023-02-21 LAB — LIPID PANEL
Chol/HDL Ratio: 3.5 ratio (ref 0.0–4.4)
Cholesterol, Total: 185 mg/dL (ref 100–199)
HDL: 53 mg/dL (ref >39)
LDL Chol Calc (NIH): 120 mg/dL — ABNORMAL HIGH (ref 0–99)
Triglycerides: 63 mg/dL (ref 0–149)
VLDL Cholesterol Cal: 12 mg/dL (ref 5–40)

## 2023-02-21 LAB — CBC WITH DIFFERENTIAL/PLATELET
Basophils Absolute: 0 10*3/uL (ref 0.0–0.2)
Basos: 0 %
EOS (ABSOLUTE): 0.2 10*3/uL (ref 0.0–0.4)
Eos: 3 %
Hematocrit: 39 % (ref 34.0–46.6)
Hemoglobin: 11.9 g/dL (ref 11.1–15.9)
Immature Grans (Abs): 0 10*3/uL (ref 0.0–0.1)
Immature Granulocytes: 0 %
Lymphocytes Absolute: 2.1 10*3/uL (ref 0.7–3.1)
Lymphs: 35 %
MCH: 23.4 pg — ABNORMAL LOW (ref 26.6–33.0)
MCHC: 30.5 g/dL — ABNORMAL LOW (ref 31.5–35.7)
MCV: 77 fL — ABNORMAL LOW (ref 79–97)
Monocytes Absolute: 0.4 10*3/uL (ref 0.1–0.9)
Monocytes: 6 %
Neutrophils Absolute: 3.4 10*3/uL (ref 1.4–7.0)
Neutrophils: 56 %
Platelets: 204 10*3/uL (ref 150–450)
RBC: 5.08 x10E6/uL (ref 3.77–5.28)
RDW: 14.4 % (ref 11.7–15.4)
WBC: 6.2 10*3/uL (ref 3.4–10.8)

## 2023-02-21 LAB — HEMOGLOBIN A1C
Est. average glucose Bld gHb Est-mCnc: 120 mg/dL
Hgb A1c MFr Bld: 5.8 % — ABNORMAL HIGH (ref 4.8–5.6)

## 2023-02-21 LAB — TSH+FREE T4
Free T4: 1.14 ng/dL (ref 0.82–1.77)
TSH: 3.15 u[IU]/mL (ref 0.450–4.500)

## 2023-02-21 LAB — VITAMIN D 25 HYDROXY (VIT D DEFICIENCY, FRACTURES): Vit D, 25-Hydroxy: 42.8 ng/mL (ref 30.0–100.0)

## 2023-02-21 NOTE — Progress Notes (Signed)
Please inform the patient that her labs indicate she has prediabetes, and I recommend reducing her intake of high-sugar foods and beverages while increasing physical activity. Additionally, her LDL cholesterol is not at the goal of less than 100, and I advise decreasing her intake of greasy foods with increased physical activity.

## 2023-02-22 ENCOUNTER — Telehealth: Payer: Self-pay

## 2023-02-22 MED ORDER — METOPROLOL TARTRATE 25 MG PO TABS: 12.5000 mg | ORAL_TABLET | Freq: Two times a day (BID) | ORAL | 2 refills | Status: AC

## 2023-02-22 NOTE — Telephone Encounter (Signed)
-----   Message from Vishnu P Mallipeddi sent at 02/22/2023 10:40 AM EDT ----- 6.3% PVC burden, symptomatic. Start metoprolol tartarate 12.5 mg twice daily, hold for BP less than 90 mm Hg SBP and HR less than 60 bpm.  If she continues to have dizziness despite metoprolol, will need to call the clinic.

## 2023-02-22 NOTE — Telephone Encounter (Signed)
Sent MyChart message and left voicemail

## 2023-03-07 ENCOUNTER — Telehealth: Payer: Medicaid Other | Admitting: Family Medicine

## 2023-03-07 NOTE — Telephone Encounter (Signed)
Patient received letter from [facility] denying her  Semaglutide-Weight Management (WEGOVY) 0.25 MG/0.5ML SOAJ  needs a prior authorization and don't have a weight chart??  Contact patient at [phone].  [facility]: [facility]

## 2023-03-07 NOTE — Telephone Encounter (Signed)
Medicaid form completed will fax back after pcp review.

## 2023-03-20 ENCOUNTER — Ambulatory Visit: Payer: Medicaid Other | Admitting: Family Medicine

## 2023-03-20 ENCOUNTER — Encounter: Payer: Self-pay | Admitting: Family Medicine

## 2023-03-20 DIAGNOSIS — Z6841 Body Mass Index (BMI) 40.0 and over, adult: Secondary | ICD-10-CM | POA: Diagnosis not present

## 2023-03-20 DIAGNOSIS — G4709 Other insomnia: Secondary | ICD-10-CM | POA: Diagnosis not present

## 2023-03-20 NOTE — Assessment & Plan Note (Addendum)
Encouraged to start taking a total of 50 mg of hydroxyzine daily, which is two 25 mg tablets, at bedtime. Reviewed sleep hygiene practices

## 2023-03-20 NOTE — Progress Notes (Addendum)
Established Patient Office Visit  Subjective:  Patient ID: Yvonne Cobb, female    DOB: 07/07/1970  Age: 52 y.o. MRN: [id number]  CC:  Chief Complaint  Patient presents with   Care Management    1 month f/u, pt reports still not sleeping well, takes medication to sleep around 9pm still not helping.     HPI Yvonne Cobb is a 52 y.o. female presents for f/u obesity and sleep disturbance.  Obesity:The patient reports that she has been implementing lifestyle changes with a heart-healthy diet and increased physical activity but has seen minimal changes in her weight.  The patient's BMI is currently 40.24.  Sleep Disturbance: She takes hydralazine 25 mg at bedtime but reports minimal relief. She also mentions having a lot on her mind, which makes it difficult for her to sleep.   Past Medical History:  Diagnosis Date   Abnormal stress test 04/10/2015   Asthma    Patient states "asthmatic bronchitis"   Chronic kidney disease    per patient, 75% right kidney function, 25% left kidney function   Depression    FH: CAD (coronary artery disease) 04/10/2015   Fibromyalgia    GERD (gastroesophageal reflux disease)     Past Surgical History:  Procedure Laterality Date   ABDOMINAL HYSTERECTOMY  2006   partial; left tube and ovary removal   CARDIAC CATHETERIZATION N/A 04/10/2015   Procedure: Left Heart Cath and Coronary Angiography;  Surgeon: Lyn Records, MD;  Location: [facility] INVASIVE CV LAB;  Service: Cardiovascular;  Laterality: N/A;   CESAREAN SECTION     ESOPHAGOGASTRODUODENOSCOPY     KIDNEY SURGERY  1990-2006   Patient states that she has had at least 10 surgeries on her left kidney   TONSILLECTOMY     TUBAL LIGATION  1997    Family History  Problem Relation Age of Onset   CAD Mother    Diabetes Mother    Hypertension Mother    Heart attack Mother    Lung cancer Father    Diabetes Father    Kidney disease Father    Diabetes Sister    Diabetes Sister      Social History   Socioeconomic History   Marital status: Divorced    Spouse name: Not on file   Number of children: Not on file   Years of education: Not on file   Highest education level: Some college, no degree  Occupational History   Not on file  Tobacco Use   Smoking status: Former    Types: Cigars    Quit date: 05/29/2013    Years since quitting: 9.8   Smokeless tobacco: Never  Vaping Use   Vaping status: Never Used  Substance and Sexual Activity   Alcohol use: No   Drug use: No   Sexual activity: Not on file  Other Topics Concern   Not on file  Social History Narrative   Not on file   Social Determinants of Health   Financial Resource Strain: Medium Risk (03/19/2023)   Overall Financial Resource Strain (CARDIA)    Difficulty of Paying Living Expenses: Somewhat hard  Food Insecurity: No Food Insecurity (03/19/2023)   Hunger Vital Sign    Worried About Running Out of Food in the Last Year: Never true    Ran Out of Food in the Last Year: Never true  Recent Concern: Food Insecurity - Food Insecurity Present (02/14/2023)   Hunger Vital Sign    Worried About Radiation protection practitioner of Food  in the Last Year: Sometimes true    Ran Out of Food in the Last Year: Never true  Transportation Needs: No Transportation Needs (03/19/2023)   PRAPARE - Administrator, Civil Service (Medical): No    Lack of Transportation (Non-Medical): No  Physical Activity: Insufficiently Active (03/19/2023)   Exercise Vital Sign    Days of Exercise per Week: 2 days    Minutes of Exercise per Session: 20 min  Stress: Stress Concern Present (03/19/2023)   [employer] of Occupational Health - Occupational Stress Questionnaire    Feeling of Stress : Rather much  Social Connections: Moderately Isolated (03/19/2023)   Social Connection and Isolation Panel [NHANES]    Frequency of Communication with Friends and Family: More than three times a week    Frequency of Social Gatherings with  Friends and Family: Twice a week    Attends Religious Services: 1 to 4 times per year    Active Member of [employer] or Organizations: No    Attends Engineer, structural: Not on file    Marital Status: Divorced  Catering manager Violence: Not on file    Outpatient Medications Prior to Visit  Medication Sig Dispense Refill   albuterol (VENTOLIN HFA) 108 (90 Base) MCG/ACT inhaler Inhale 1-2 puffs into the lungs every 6 (six) hours as needed for wheezing or shortness of breath. 6.7 g 1   budesonide-formoterol (SYMBICORT) 160-4.5 MCG/ACT inhaler Inhale 2 puffs into the lungs 2 (two) times daily. 6 g 3   esomeprazole (NEXIUM) 20 MG capsule Take 20 mg by mouth daily at 12 noon.     hydrOXYzine (VISTARIL) 25 MG capsule Take 1 capsule (25 mg total) by mouth at bedtime as needed. 30 capsule 0   meclizine (ANTIVERT) 25 MG tablet Take 1 tablet (25 mg total) by mouth 3 (three) times daily as needed for dizziness. 270 tablet 1   metoprolol tartrate (LOPRESSOR) 25 MG tablet Take 0.5 tablets (12.5 mg total) by mouth 2 (two) times daily. Hold if BP is less than 90 mm SBP and HR is less than 60 135 tablet 2   promethazine-dextromethorphan (PROMETHAZINE-DM) 6.25-15 MG/5ML syrup Take 5 mLs by mouth 4 (four) times daily as needed for cough.     Semaglutide-Weight Management (WEGOVY) 0.25 MG/0.5ML SOAJ Inject 0.25 mg into the skin once a week. 2 mL 0   Vitamin D, Ergocalciferol, (DRISDOL) 1.25 MG (50000 UNIT) CAPS capsule Take 1 capsule (50,000 Units total) by mouth every 7 (seven) days. 20 capsule 1   No facility-administered medications prior to visit.    Allergies  Allergen Reactions   Hydrocodone Itching    Hives/itching   Tape Itching    "paper tape"    ROS Review of Systems  Constitutional:  Negative for chills and fever.  Eyes:  Negative for visual disturbance.  Respiratory:  Negative for chest tightness and shortness of breath.   Neurological:  Negative for dizziness and headaches.       Objective:    Physical Exam Constitutional:      Appearance: She is obese.  HENT:     Head: Normocephalic.     Mouth/Throat:     Mouth: Mucous membranes are moist.  Cardiovascular:     Rate and Rhythm: Normal rate.     Heart sounds: Normal heart sounds.  Pulmonary:     Effort: Pulmonary effort is normal.     Breath sounds: Normal breath sounds.  Neurological:     Mental Status: She is  alert.     BP 124/82   Pulse 60   Ht 5' 6.75" (1.695 m)   Wt 255 lb 0.6 oz (115.7 kg)   SpO2 98%   BMI 40.24 kg/m  Wt Readings from Last 3 Encounters:  04/19/23 256 lb 1.9 oz (116.2 kg)  03/20/23 255 lb 0.6 oz (115.7 kg)  02/15/23 251 lb 1.3 oz (113.9 kg)    Lab Results  Component Value Date   TSH 3.150 02/20/2023   Lab Results  Component Value Date   WBC 6.2 02/20/2023   HGB 11.9 02/20/2023   HCT 39.0 02/20/2023   MCV 77 (L) 02/20/2023   PLT 204 02/20/2023   Lab Results  Component Value Date   NA 141 02/20/2023   K 4.4 02/20/2023   CO2 22 02/20/2023   GLUCOSE 98 02/20/2023   BUN 13 02/20/2023   CREATININE 1.01 (H) 02/20/2023   BILITOT 0.2 02/20/2023   ALKPHOS 95 02/20/2023   AST 10 02/20/2023   ALT 13 02/20/2023   PROT 7.0 02/20/2023   ALBUMIN 3.9 02/20/2023   CALCIUM 9.0 02/20/2023   ANIONGAP 9 10/28/2022   EGFR 68 02/20/2023   Lab Results  Component Value Date   CHOL 185 02/20/2023   Lab Results  Component Value Date   HDL 53 02/20/2023   Lab Results  Component Value Date   LDLCALC 120 (H) 02/20/2023   Lab Results  Component Value Date   TRIG 63 02/20/2023   Lab Results  Component Value Date   CHOLHDL 3.5 02/20/2023   Lab Results  Component Value Date   HGBA1C 5.8 (H) 02/20/2023      Assessment & Plan:  Morbid obesity (HCC) Assessment & Plan: Wt Readings from Last 3 Encounters:  03/20/23 255 lb 0.6 oz (115.7 kg)  02/15/23 251 lb 1.3 oz (113.9 kg)  01/18/23 243 lb (110.2 kg)  A prescription for Wegovy 0.25 mg has been sent to her  pharmacy. The patient is encouraged to implement lifestyle changes, including a heart-healthy diet and increased physical activity, in conjunction with the current treatment regimen.     Other insomnia Assessment & Plan: Encouraged to start taking a total of 50 mg of hydroxyzine daily, which is two 25 mg tablets, at bedtime. Reviewed sleep hygiene practices   Note: This chart has been completed using Engineer, civil (consulting) software, and while attempts have been made to ensure accuracy, certain words and phrases may not be transcribed as intended.   Follow-up: Return in about 1 month (around 04/20/2023).   Gilmore Laroche, FNP

## 2023-03-20 NOTE — Assessment & Plan Note (Addendum)
Wt Readings from Last 3 Encounters:  03/20/23 255 lb 0.6 oz (115.7 kg)  02/15/23 251 lb 1.3 oz (113.9 kg)  01/18/23 243 lb (110.2 kg)  A prescription for Wegovy 0.25 mg has been sent to her pharmacy. The patient is encouraged to implement lifestyle changes, including a heart-healthy diet and increased physical activity, in conjunction with the current treatment regimen.

## 2023-03-20 NOTE — Patient Instructions (Addendum)
I appreciate the opportunity to provide care to you today!    Follow up:  1 months   Please start taking a total of 50 mg of hydralazine daily, which is two 25 mg tablets, at bedtime.   Attached with your AVS, you will find valuable resources for self-education. I highly recommend dedicating some time to thoroughly examine them.   Please continue to a heart-healthy diet and increase your physical activities. Try to exercise for at least five days a week.    It was a pleasure to see you and I look forward to continuing to work together on your health and well-being. Please do not hesitate to call the office if you need care or have questions about your care.  In case of emergency, please visit the Emergency Department for urgent care, or contact our clinic at [phone] to schedule an appointment. We're here to help you!   Have a wonderful day and week. With Gratitude, Gilmore Laroche MSN, FNP-BC

## 2023-04-19 ENCOUNTER — Encounter: Payer: Self-pay | Admitting: Family Medicine

## 2023-04-19 ENCOUNTER — Ambulatory Visit: Payer: Medicaid Other | Admitting: Family Medicine

## 2023-04-19 DIAGNOSIS — Z6841 Body Mass Index (BMI) 40.0 and over, adult: Secondary | ICD-10-CM | POA: Diagnosis not present

## 2023-04-19 DIAGNOSIS — K219 Gastro-esophageal reflux disease without esophagitis: Secondary | ICD-10-CM

## 2023-04-19 DIAGNOSIS — N951 Menopausal and female climacteric states: Secondary | ICD-10-CM | POA: Insufficient documentation

## 2023-04-19 MED ORDER — PANTOPRAZOLE SODIUM 40 MG PO TBEC
40.0000 mg | DELAYED_RELEASE_TABLET | Freq: Every day | ORAL | 1 refills | Status: DC
Start: 2023-04-19 — End: 2023-07-24

## 2023-04-19 NOTE — Patient Instructions (Addendum)
I appreciate the opportunity to provide care to you today!    Follow up:  3 months   Hot Flashes: Start taking the over-the-counter Estroven menopause supplement.  GERD Management: Start taking Protonix 40 mg daily. For managing GERD, I recommend the following lifestyle changes:  Avoid Certain Foods and Drinks: Limit or eliminate coffee, chocolate, onions, peppermint, spicy foods, carbonated beverages, citrus fruits, tomatoes, garlic, alcohol, and fatty foods such as bacon, burgers, sausages, steak, fried foods, and dairy products.  Recommended Foods: Increase your intake of high-fiber foods, including whole grain cereals, oatmeal, brown rice, root vegetables, and non-citrus fruits. Opt for high-protein foods and healthy fats such as avocados, olive oil, nuts, and seeds.    Attached with your AVS, you will find valuable resources for self-education. I highly recommend dedicating some time to thoroughly examine them.   Please continue to a heart-healthy diet and increase your physical activities. Try to exercise for at least five days a week.    It was a pleasure to see you and I look forward to continuing to work together on your health and well-being. Please do not hesitate to call the office if you need care or have questions about your care.  In case of emergency, please visit the Emergency Department for urgent care, or contact our clinic at [phone] to schedule an appointment. We're here to help you!   Have a wonderful day and week. With Gratitude, Gilmore Laroche MSN, FNP-BC

## 2023-04-19 NOTE — Progress Notes (Signed)
Established Patient Office Visit  Subjective:  Patient ID: Yvonne Cobb, female    DOB: May 05, 1971  Age: 52 y.o. MRN: [id number]  CC:  Chief Complaint  Patient presents with   Care Management    1 month f/u, pt reports being without wegovy for a month.    HPI Yvonne Cobb is a 52 y.o. female with past medical history of obesity presents for obesity follow-up with complaints of heartburn and hot flashes symptoms. For the details of today's visit, please refer to the assessment and plan.     Past Medical History:  Diagnosis Date   Abnormal stress test 04/10/2015   Asthma    Patient states "asthmatic bronchitis"   Chronic kidney disease    per patient, 75% right kidney function, 25% left kidney function   Depression    FH: CAD (coronary artery disease) 04/10/2015   Fibromyalgia    GERD (gastroesophageal reflux disease)     Past Surgical History:  Procedure Laterality Date   ABDOMINAL HYSTERECTOMY  2006   partial; left tube and ovary removal   CARDIAC CATHETERIZATION N/A 04/10/2015   Procedure: Left Heart Cath and Coronary Angiography;  Surgeon: Lyn Records, MD;  Location: [facility] INVASIVE CV LAB;  Service: Cardiovascular;  Laterality: N/A;   CESAREAN SECTION     ESOPHAGOGASTRODUODENOSCOPY     KIDNEY SURGERY  1990-2006   Patient states that she has had at least 10 surgeries on her left kidney   TONSILLECTOMY     TUBAL LIGATION  1997    Family History  Problem Relation Age of Onset   CAD Mother    Diabetes Mother    Hypertension Mother    Heart attack Mother    Lung cancer Father    Diabetes Father    Kidney disease Father    Diabetes Sister    Diabetes Sister     Social History   Socioeconomic History   Marital status: Divorced    Spouse name: Not on file   Number of children: Not on file   Years of education: Not on file   Highest education level: Some college, no degree  Occupational History   Not on file  Tobacco Use   Smoking status: Former     Types: Cigars    Quit date: 05/29/2013    Years since quitting: 9.8   Smokeless tobacco: Never  Vaping Use   Vaping status: Never Used  Substance and Sexual Activity   Alcohol use: No   Drug use: No   Sexual activity: Not on file  Other Topics Concern   Not on file  Social History Narrative   Not on file   Social Determinants of Health   Financial Resource Strain: Medium Risk (03/19/2023)   Overall Financial Resource Strain (CARDIA)    Difficulty of Paying Living Expenses: Somewhat hard  Food Insecurity: No Food Insecurity (03/19/2023)   Hunger Vital Sign    Worried About Running Out of Food in the Last Year: Never true    Ran Out of Food in the Last Year: Never true  Recent Concern: Food Insecurity - Food Insecurity Present (02/14/2023)   Hunger Vital Sign    Worried About Running Out of Food in the Last Year: Sometimes true    Ran Out of Food in the Last Year: Never true  Transportation Needs: No Transportation Needs (03/19/2023)   PRAPARE - Administrator, Civil Service (Medical): No    Lack of Transportation (Non-Medical): No  Physical Activity: Insufficiently Active (03/19/2023)   Exercise Vital Sign    Days of Exercise per Week: 2 days    Minutes of Exercise per Session: 20 min  Stress: Stress Concern Present (03/19/2023)   [employer] of Occupational Health - Occupational Stress Questionnaire    Feeling of Stress : Rather much  Social Connections: Moderately Isolated (03/19/2023)   Social Connection and Isolation Panel [NHANES]    Frequency of Communication with Friends and Family: More than three times a week    Frequency of Social Gatherings with Friends and Family: Twice a week    Attends Religious Services: 1 to 4 times per year    Active Member of [employer] or Organizations: No    Attends Engineer, structural: Not on file    Marital Status: Divorced  Catering manager Violence: Not on file    Outpatient Medications Prior to Visit   Medication Sig Dispense Refill   albuterol (VENTOLIN HFA) 108 (90 Base) MCG/ACT inhaler Inhale 1-2 puffs into the lungs every 6 (six) hours as needed for wheezing or shortness of breath. 6.7 g 1   budesonide-formoterol (SYMBICORT) 160-4.5 MCG/ACT inhaler Inhale 2 puffs into the lungs 2 (two) times daily. 6 g 3   hydrOXYzine (VISTARIL) 25 MG capsule Take 1 capsule (25 mg total) by mouth at bedtime as needed. 30 capsule 0   meclizine (ANTIVERT) 25 MG tablet Take 1 tablet (25 mg total) by mouth 3 (three) times daily as needed for dizziness. 270 tablet 1   metoprolol tartrate (LOPRESSOR) 25 MG tablet Take 0.5 tablets (12.5 mg total) by mouth 2 (two) times daily. Hold if BP is less than 90 mm SBP and HR is less than 60 135 tablet 2   promethazine-dextromethorphan (PROMETHAZINE-DM) 6.25-15 MG/5ML syrup Take 5 mLs by mouth 4 (four) times daily as needed for cough.     Semaglutide-Weight Management (WEGOVY) 0.25 MG/0.5ML SOAJ Inject 0.25 mg into the skin once a week. 2 mL 0   Vitamin D, Ergocalciferol, (DRISDOL) 1.25 MG (50000 UNIT) CAPS capsule Take 1 capsule (50,000 Units total) by mouth every 7 (seven) days. 20 capsule 1   esomeprazole (NEXIUM) 20 MG capsule Take 20 mg by mouth daily at 12 noon.     No facility-administered medications prior to visit.    Allergies  Allergen Reactions   Hydrocodone Itching    Hives/itching   Tape Itching    "paper tape"    ROS Review of Systems  Constitutional:  Negative for chills and fever.  Eyes:  Negative for visual disturbance.  Respiratory:  Negative for chest tightness and shortness of breath.   Neurological:  Negative for dizziness and headaches.      Objective:    Physical Exam Constitutional:      Appearance: She is obese.  HENT:     Head: Normocephalic.     Mouth/Throat:     Mouth: Mucous membranes are moist.  Cardiovascular:     Rate and Rhythm: Normal rate.     Heart sounds: Normal heart sounds.  Pulmonary:     Effort: Pulmonary  effort is normal.     Breath sounds: Normal breath sounds.  Neurological:     Mental Status: She is alert.     BP 118/80   Pulse 60   Ht 5' 6.75" (1.695 m)   Wt 256 lb 1.9 oz (116.2 kg)   SpO2 98%   BMI 40.42 kg/m  Wt Readings from Last 3 Encounters:  04/19/23 256 lb 1.9  oz (116.2 kg)  03/20/23 255 lb 0.6 oz (115.7 kg)  02/15/23 251 lb 1.3 oz (113.9 kg)    Lab Results  Component Value Date   TSH 3.150 02/20/2023   Lab Results  Component Value Date   WBC 6.2 02/20/2023   HGB 11.9 02/20/2023   HCT 39.0 02/20/2023   MCV 77 (L) 02/20/2023   PLT 204 02/20/2023   Lab Results  Component Value Date   NA 141 02/20/2023   K 4.4 02/20/2023   CO2 22 02/20/2023   GLUCOSE 98 02/20/2023   BUN 13 02/20/2023   CREATININE 1.01 (H) 02/20/2023   BILITOT 0.2 02/20/2023   ALKPHOS 95 02/20/2023   AST 10 02/20/2023   ALT 13 02/20/2023   PROT 7.0 02/20/2023   ALBUMIN 3.9 02/20/2023   CALCIUM 9.0 02/20/2023   ANIONGAP 9 10/28/2022   EGFR 68 02/20/2023   Lab Results  Component Value Date   CHOL 185 02/20/2023   Lab Results  Component Value Date   HDL 53 02/20/2023   Lab Results  Component Value Date   LDLCALC 120 (H) 02/20/2023   Lab Results  Component Value Date   TRIG 63 02/20/2023   Lab Results  Component Value Date   CHOLHDL 3.5 02/20/2023   Lab Results  Component Value Date   HGBA1C 5.8 (H) 02/20/2023      Assessment & Plan:  Morbid obesity (HCC) Assessment & Plan: Wt Readings from Last 3 Encounters:  04/19/23 256 lb 1.9 oz (116.2 kg)  03/20/23 255 lb 0.6 oz (115.7 kg)  02/15/23 251 lb 1.3 oz (113.9 kg)  The patient reports that she has been implementing lifestyle changes for the past 3 months with minimal changes in her weight. Her weight today is 256 pounds, and her BMI is 40.42. I initiated therapy with WGNFAO; however, the patient reports that the medication was denied. I will resubmit the form. In the interim, I advised the patient to adhere to a  heart-healthy diet and increase physical activity.      Hot flashes due to menopause Assessment & Plan: The patient complains of increased symptoms of hot flashes, especially at nighttime. Paroxetine 7.5 mg interacts with her metoprolol. She was encouraged to take an over-the-counter supplement, Estroven Menopausal Relief.    Gastroesophageal reflux disease without esophagitis Assessment & Plan: The patient reports increased symptoms of heartburn and indigestion. She has been on long-term therapy with Nexium 20 mg but reports minimal relief recently. We will initiate therapy with Protonix 40 mg daily and discontinue Nexium 20 mg. Adherence to a GERD-friendly diet was encouraged.   Orders: -     Pantoprazole Sodium; Take 1 tablet (40 mg total) by mouth daily.  Dispense: 60 tablet; Refill: 1  Note: This chart has been completed using Engineer, civil (consulting) software, and while attempts have been made to ensure accuracy, certain words and phrases may not be transcribed as intended.    Follow-up: No follow-ups on file.   Gilmore Laroche, FNP

## 2023-04-19 NOTE — Assessment & Plan Note (Signed)
Wt Readings from Last 3 Encounters:  04/19/23 256 lb 1.9 oz (116.2 kg)  03/20/23 255 lb 0.6 oz (115.7 kg)  02/15/23 251 lb 1.3 oz (113.9 kg)  The patient reports that she has been implementing lifestyle changes for the past 3 months with minimal changes in her weight. Her weight today is 256 pounds, and her BMI is 40.42. I initiated therapy with NGEXBM; however, the patient reports that the medication was denied. I will resubmit the form. In the interim, I advised the patient to adhere to a heart-healthy diet and increase physical activity.

## 2023-04-19 NOTE — Assessment & Plan Note (Signed)
The patient complains of increased symptoms of hot flashes, especially at nighttime. Paroxetine 7.5 mg interacts with her metoprolol. She was encouraged to take an over-the-counter supplement, Estroven Menopausal Relief.

## 2023-04-19 NOTE — Assessment & Plan Note (Signed)
The patient reports increased symptoms of heartburn and indigestion. She has been on long-term therapy with Nexium 20 mg but reports minimal relief recently. We will initiate therapy with Protonix 40 mg daily and discontinue Nexium 20 mg. Adherence to a GERD-friendly diet was encouraged.

## 2023-05-03 ENCOUNTER — Encounter (HOSPITAL_COMMUNITY): Payer: Self-pay | Admitting: *Deleted

## 2023-05-03 ENCOUNTER — Other Ambulatory Visit: Payer: Self-pay

## 2023-05-03 ENCOUNTER — Emergency Department (HOSPITAL_COMMUNITY)
Admission: EM | Admit: 2023-05-03 | Discharge: 2023-05-03 | Disposition: A | Discharge status: Home / Self Care | Payer: No Typology Code available for payment source | Attending: Emergency Medicine | Admitting: Emergency Medicine

## 2023-05-03 ENCOUNTER — Emergency Department (HOSPITAL_COMMUNITY): Payer: No Typology Code available for payment source

## 2023-05-03 DIAGNOSIS — S199XXA Unspecified injury of neck, initial encounter: Secondary | ICD-10-CM | POA: Diagnosis present

## 2023-05-03 DIAGNOSIS — R519 Headache, unspecified: Secondary | ICD-10-CM | POA: Diagnosis not present

## 2023-05-03 DIAGNOSIS — R0789 Other chest pain: Secondary | ICD-10-CM | POA: Diagnosis not present

## 2023-05-03 DIAGNOSIS — S161XXA Strain of muscle, fascia and tendon at neck level, initial encounter: Secondary | ICD-10-CM | POA: Insufficient documentation

## 2023-05-03 DIAGNOSIS — M7918 Myalgia, other site: Secondary | ICD-10-CM

## 2023-05-03 DIAGNOSIS — Y92410 Unspecified street and highway as the place of occurrence of the external cause: Secondary | ICD-10-CM | POA: Insufficient documentation

## 2023-05-03 DIAGNOSIS — N189 Chronic kidney disease, unspecified: Secondary | ICD-10-CM | POA: Insufficient documentation

## 2023-05-03 DIAGNOSIS — Z79899 Other long term (current) drug therapy: Secondary | ICD-10-CM | POA: Insufficient documentation
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
## 2023-05-03 NOTE — ED Notes (Signed)
Patient transported to CT 
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
## 2023-05-26 ENCOUNTER — Ambulatory Visit: Payer: Self-pay | Admitting: Family Medicine
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
## 2023-05-26 NOTE — Telephone Encounter (Signed)
Scheduled to see Dr. Lodema Hong on 05/30/2023

## 2023-05-30 ENCOUNTER — Encounter: Payer: Self-pay | Admitting: Family Medicine

## 2023-05-30 ENCOUNTER — Ambulatory Visit: Payer: Self-pay | Admitting: Family Medicine

## 2023-05-30 DIAGNOSIS — M5441 Lumbago with sciatica, right side: Secondary | ICD-10-CM

## 2023-05-30 DIAGNOSIS — M542 Cervicalgia: Secondary | ICD-10-CM | POA: Diagnosis not present
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
## 2023-05-31 DIAGNOSIS — M5441 Lumbago with sciatica, right side: Secondary | ICD-10-CM | POA: Insufficient documentation

## 2023-05-31 DIAGNOSIS — M542 Cervicalgia: Secondary | ICD-10-CM | POA: Insufficient documentation

## 2023-05-31 NOTE — Assessment & Plan Note (Signed)
 Refer Ortho for ongoing management Has meds from recent UC visit which she is to take

## 2023-05-31 NOTE — Assessment & Plan Note (Signed)
 Ongoing neck and back pain with spasm, refer Ortho for management, conti ue meds fro recetn urgent care visit

## 2023-05-31 NOTE — Assessment & Plan Note (Signed)
 Meds from [facility], advised PT rather than chiropcter, Ortho to manage

## 2023-06-07 ENCOUNTER — Encounter: Payer: Self-pay | Admitting: Orthopaedic Surgery

## 2023-06-07 ENCOUNTER — Ambulatory Visit (INDEPENDENT_AMBULATORY_CARE_PROVIDER_SITE_OTHER): Payer: Medicaid Other | Admitting: Orthopaedic Surgery

## 2023-06-07 VITALS — Ht 66.0 in | Wt 260.0 lb

## 2023-06-07 DIAGNOSIS — M47812 Spondylosis without myelopathy or radiculopathy, cervical region: Secondary | ICD-10-CM | POA: Diagnosis not present

## 2023-06-07 DIAGNOSIS — M542 Cervicalgia: Secondary | ICD-10-CM
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
## 2023-07-24 ENCOUNTER — Ambulatory Visit: Payer: Medicaid Other | Admitting: Family Medicine

## 2023-07-24 ENCOUNTER — Encounter: Payer: Self-pay | Admitting: Family Medicine

## 2023-07-24 VITALS — BP 145/86 | HR 57 | Ht 66.0 in | Wt 264.1 lb

## 2023-07-24 DIAGNOSIS — Z23 Encounter for immunization: Secondary | ICD-10-CM

## 2023-07-24 DIAGNOSIS — E559 Vitamin D deficiency, unspecified: Secondary | ICD-10-CM

## 2023-07-24 DIAGNOSIS — K219 Gastro-esophageal reflux disease without esophagitis: Secondary | ICD-10-CM

## 2023-07-24 DIAGNOSIS — S39012D Strain of muscle, fascia and tendon of lower back, subsequent encounter: Secondary | ICD-10-CM | POA: Diagnosis not present

## 2023-07-24 DIAGNOSIS — R7301 Impaired fasting glucose: Secondary | ICD-10-CM

## 2023-07-24 DIAGNOSIS — J4520 Mild intermittent asthma, uncomplicated: Secondary | ICD-10-CM

## 2023-07-24 DIAGNOSIS — E038 Other specified hypothyroidism: Secondary | ICD-10-CM

## 2023-07-24 DIAGNOSIS — E7849 Other hyperlipidemia: Secondary | ICD-10-CM

## 2023-07-24 DIAGNOSIS — G479 Sleep disorder, unspecified: Secondary | ICD-10-CM

## 2023-07-24 MED ORDER — WEGOVY 0.25 MG/0.5ML ~~LOC~~ SOAJ
0.2500 mg | SUBCUTANEOUS | 0 refills | Status: DC
Start: 2023-07-24 — End: 2023-09-04

## 2023-07-24 MED ORDER — NAPROXEN 500 MG PO TABS: 500.0000 mg | ORAL_TABLET | Freq: Two times a day (BID) | ORAL | 1 refills | Status: DC

## 2023-07-24 MED ORDER — VITAMIN D (ERGOCALCIFEROL) 1.25 MG (50000 UNIT) PO CAPS: 50000.0000 [IU] | ORAL_CAPSULE | ORAL | 1 refills | Status: AC

## 2023-07-24 MED ORDER — PANTOPRAZOLE SODIUM 40 MG PO TBEC: 40.0000 mg | DELAYED_RELEASE_TABLET | Freq: Every day | ORAL | 1 refills | Status: DC

## 2023-07-24 MED ORDER — ALBUTEROL SULFATE HFA 108 (90 BASE) MCG/ACT IN AERS: 1.0000 | INHALATION_SPRAY | Freq: Four times a day (QID) | RESPIRATORY_TRACT | 1 refills | Status: AC | PRN

## 2023-07-24 MED ORDER — HYDROXYZINE PAMOATE 25 MG PO CAPS: 25.0000 mg | ORAL_CAPSULE | Freq: Every evening | ORAL | 0 refills | Status: AC | PRN

## 2023-07-24 NOTE — Patient Instructions (Addendum)
 I appreciate the opportunity to provide care to you today!    Follow up:  4 months  Labs: please stop by the lab today/ during the week to get your blood drawn (CBC, CMP, TSH, Lipid profile, HgA1c, Vit D)   Here are some foods to avoid or reduce in your diet to help manage cholesterol levels:  Fried Foods:Deep-fried items such as french fries, fried chicken, and fried snacks are high in unhealthy fats and can raise LDL (bad) cholesterol levels. Processed Meats:Foods like bacon, sausage, hot dogs, and deli meats are often high in saturated fat and cholesterol. Full-Fat Dairy Products:Whole milk, full-fat yogurt, butter, cream, and cheese are rich in saturated fats, which can increase cholesterol levels. Baked Goods and Sweets:Pastries, cakes, cookies, and donuts often contain trans fats and added sugars, which can raise LDL cholesterol and lower HDL (good) cholesterol. Red Meat:Beef, lamb, and pork are high in saturated fat. Lean cuts or plant-based protein alternatives are better options. Lard and Shortening:Used in some baked goods, lard and shortening are high in trans fats and should be avoided. Fast Food:Many fast food items are cooked with unhealthy oils and contain high amounts of saturated and trans fats. Processed Snacks:Chips, crackers, and certain microwave popcorns can contain trans fats and high levels of unhealthy oils. Shellfish:While nutritious in other ways, some shellfish like shrimp, lobster, and crab are high in cholesterol. They should be consumed in moderation. Coconut and Palm Oils:these oils are high in saturated fat and can raise cholesterol levels when used in cooking or baking.      Please continue to a heart-healthy diet and increase your physical activities. Try to exercise for at least five days a week.    It was a pleasure to see you and I look forward to continuing to work together on your health and well-being. Please do not hesitate to call the office  if you need care or have questions about your care.  In case of emergency, please visit the Emergency Department for urgent care, or contact our clinic at [phone] to schedule an appointment. We're here to help you!   Have a wonderful day and week. With Gratitude, Gilmore Laroche MSN, FNP-BC
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
## 2023-07-25 ENCOUNTER — Telehealth: Payer: Self-pay

## 2023-07-25 LAB — TSH+FREE T4
Free T4: 1.26 ng/dL (ref 0.82–1.77)
TSH: 2.65 u[IU]/mL (ref 0.450–4.500)

## 2023-07-25 LAB — CBC WITH DIFFERENTIAL/PLATELET
Basophils Absolute: 0 10*3/uL (ref 0.0–0.2)
Basos: 0 %
EOS (ABSOLUTE): 0.2 10*3/uL (ref 0.0–0.4)
Eos: 2 %
Hematocrit: 42.2 % (ref 34.0–46.6)
Hemoglobin: 12.7 g/dL (ref 11.1–15.9)
Immature Grans (Abs): 0 10*3/uL (ref 0.0–0.1)
Immature Granulocytes: 0 %
Lymphocytes Absolute: 2.7 10*3/uL (ref 0.7–3.1)
Lymphs: 34 %
MCH: 23.3 pg — ABNORMAL LOW (ref 26.6–33.0)
MCHC: 30.1 g/dL — ABNORMAL LOW (ref 31.5–35.7)
MCV: 77 fL — ABNORMAL LOW (ref 79–97)
Monocytes Absolute: 0.4 10*3/uL (ref 0.1–0.9)
Monocytes: 5 %
Neutrophils Absolute: 4.8 10*3/uL (ref 1.4–7.0)
Neutrophils: 59 %
Platelets: 236 10*3/uL (ref 150–450)
RBC: 5.45 x10E6/uL — ABNORMAL HIGH (ref 3.77–5.28)
RDW: 15 % (ref 11.7–15.4)
WBC: 8.1 10*3/uL (ref 3.4–10.8)

## 2023-07-25 LAB — CMP14+EGFR
ALT: 14 IU/L (ref 0–32)
AST: 12 IU/L (ref 0–40)
Albumin: 4 g/dL (ref 3.9–4.9)
Alkaline Phosphatase: 97 IU/L (ref 44–121)
BUN/Creatinine Ratio: 13 (ref 9–23)
BUN: 14 mg/dL (ref 6–24)
Bilirubin Total: 0.5 mg/dL (ref 0.0–1.2)
CO2: 23 mmol/L (ref 20–29)
Calcium: 9.4 mg/dL (ref 8.7–10.2)
Chloride: 100 mmol/L (ref 96–106)
Creatinine, Ser: 1.05 mg/dL — ABNORMAL HIGH (ref 0.57–1.00)
Globulin, Total: 3.1 g/dL (ref 1.5–4.5)
Glucose: 71 mg/dL (ref 70–99)
Potassium: 4.7 mmol/L (ref 3.5–5.2)
Sodium: 138 mmol/L (ref 134–144)
Total Protein: 7.1 g/dL (ref 6.0–8.5)
eGFR: 65 mL/min/{1.73_m2} (ref >59)

## 2023-07-25 LAB — LIPID PANEL
Chol/HDL Ratio: 3.2 ratio (ref 0.0–4.4)
Cholesterol, Total: 194 mg/dL (ref 100–199)
HDL: 61 mg/dL (ref >39)
LDL Chol Calc (NIH): 123 mg/dL — ABNORMAL HIGH (ref 0–99)
Triglycerides: 56 mg/dL (ref 0–149)
VLDL Cholesterol Cal: 10 mg/dL (ref 5–40)

## 2023-07-25 LAB — HEMOGLOBIN A1C
Est. average glucose Bld gHb Est-mCnc: 120 mg/dL
Hgb A1c MFr Bld: 5.8 % — ABNORMAL HIGH (ref 4.8–5.6)

## 2023-07-25 LAB — VITAMIN D 25 HYDROXY (VIT D DEFICIENCY, FRACTURES): Vit D, 25-Hydroxy: 39.3 ng/mL (ref 30.0–100.0)

## 2023-07-25 NOTE — Telephone Encounter (Signed)
 Copied from CRM [id number]. Topic: Clinical - Medication Question >> Jul 25, 2023  3:56 PM Nyra Capes wrote: Reason for CRM: Patient calling in stating insurance, Medicaid requires pre authorization for this medication Semaglutide-Weight Management (WEGOVY) 0.25 MG/0.5ML Patient called the pharmacy requires authorization for the medication.

## 2023-07-26 ENCOUNTER — Ambulatory Visit: Payer: Medicaid Other | Admitting: Orthopaedic Surgery

## 2023-07-26 DIAGNOSIS — S39012A Strain of muscle, fascia and tendon of lower back, initial encounter: Secondary | ICD-10-CM | POA: Insufficient documentation

## 2023-07-26 DIAGNOSIS — Z23 Encounter for immunization: Secondary | ICD-10-CM | POA: Insufficient documentation

## 2023-07-26 NOTE — Assessment & Plan Note (Signed)
 The patient reports that she was unable to pick up her [facility] prescription from the pharmacy; therefore, she has not started therapy. I will send a refill today and encourage medication adherence, along with lifestyle modifications, including a heart-healthy diet and increased physical activity of at least 150 minutes of moderate-intensity exercise per week. Wt Readings from Last 3 Encounters:  07/24/23 264 lb 1.9 oz (119.8 kg)  06/07/23 260 lb (117.9 kg)  05/30/23 260 lb 1.3 oz (118 kg)

## 2023-07-26 NOTE — Assessment & Plan Note (Signed)
 Stable. Refill sent to the pharmacy. Encouraged adherence to a GERD-friendly diet.  Marland Kitchen

## 2023-07-26 NOTE — Assessment & Plan Note (Signed)
 Patient educated on CDC recommendation for the vaccine. Verbal consent was obtained from the patient, vaccine administered by nurse, no sign of adverse reactions noted at this time. Patient education on arm soreness and use of tylenol or ibuprofen for this patient  was discussed. Patient educated on the signs and symptoms of adverse effect and advise to contact the office if they occur.

## 2023-07-26 NOTE — Assessment & Plan Note (Addendum)
 Encouraged the patient to continue taking Flexeril as prescribed. Will initiate therapy with naproxen 500 mg, to be taken twice daily as needed for pain relief. I recommend rest, heat application to the affected site, and avoidance of aggravating activities. The patient is encouraged to follow up if symptoms worsen or fail to improve.

## 2023-07-27 ENCOUNTER — Ambulatory Visit (INDEPENDENT_AMBULATORY_CARE_PROVIDER_SITE_OTHER): Payer: Medicaid Other | Admitting: Orthopaedic Surgery

## 2023-07-27 ENCOUNTER — Encounter: Payer: Self-pay | Admitting: Orthopaedic Surgery

## 2023-07-27 ENCOUNTER — Other Ambulatory Visit: Payer: Self-pay | Admitting: Family Medicine

## 2023-07-27 VITALS — Ht 66.0 in | Wt 264.0 lb

## 2023-07-27 DIAGNOSIS — S39012D Strain of muscle, fascia and tendon of lower back, subsequent encounter: Secondary | ICD-10-CM

## 2023-07-27 DIAGNOSIS — M47812 Spondylosis without myelopathy or radiculopathy, cervical region: Secondary | ICD-10-CM

## 2023-07-27 NOTE — Telephone Encounter (Signed)
 Prior auth submitted, waiting on decision
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
## 2023-08-04 ENCOUNTER — Encounter: Payer: Self-pay | Admitting: Family Medicine

## 2023-08-23 ENCOUNTER — Ambulatory Visit (INDEPENDENT_AMBULATORY_CARE_PROVIDER_SITE_OTHER): Admitting: Orthopaedic Surgery

## 2023-08-23 ENCOUNTER — Encounter: Payer: Self-pay | Admitting: Orthopaedic Surgery

## 2023-08-23 DIAGNOSIS — S39012S Strain of muscle, fascia and tendon of lower back, sequela: Secondary | ICD-10-CM

## 2023-08-23 DIAGNOSIS — S39012D Strain of muscle, fascia and tendon of lower back, subsequent encounter: Secondary | ICD-10-CM
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
## 2023-08-24 ENCOUNTER — Telehealth: Payer: Self-pay | Admitting: Pharmacy Technician

## 2023-08-24 ENCOUNTER — Other Ambulatory Visit (HOSPITAL_COMMUNITY): Payer: Self-pay

## 2023-08-24 NOTE — Telephone Encounter (Signed)
 Pharmacy Patient Advocate Encounter   Received notification from CoverMyMeds that prior authorization for Wegovy 0.25MG /0.5ML auto-injectors is required/requested.   Insurance verification completed.   The patient is insured through [employer] .   Per test claim: PA required; PA submitted to above mentioned insurance via CoverMyMeds Key/confirmation #/EOC [id number] Status is pending

## 2023-08-30 ENCOUNTER — Other Ambulatory Visit (HOSPITAL_COMMUNITY): Payer: Self-pay

## 2023-08-30 NOTE — Telephone Encounter (Signed)
 ADDITIONAL INFORMATION FAXED TO [employer] TODAY AT 9:34AM

## 2023-08-30 NOTE — Telephone Encounter (Signed)
 Pharmacy Patient Advocate Encounter  Received notification from [facility] that Prior Authorization for Wegovy 0.25MG /0.5ML auto-injectors  has been APPROVED from 08/30/2023 to 03/13/2024. Ran test claim, Copay is $4.00. This test claim was processed through [facility]- copay amounts may vary at other pharmacies due to pharmacy/plan contracts, or as the patient moves through the different stages of their insurance plan.   PA #/Case ID/Reference #: [id number]

## 2023-09-01 ENCOUNTER — Telehealth: Payer: Self-pay | Admitting: Family Medicine

## 2023-09-01 NOTE — Telephone Encounter (Signed)
 Copied from CRM [id number]. Topic: Clinical - Medication Question >> Aug 31, 2023  2:59 PM Victorino Dike T wrote: Reason for CRM: Semaglutide-Weight Management (WEGOVY) 0.25 MG/0.5ML SOAJ- asking if next dosage will be increased- needs the refill [phone]

## 2023-09-04 ENCOUNTER — Other Ambulatory Visit: Payer: Self-pay | Admitting: Family Medicine

## 2023-09-04 MED ORDER — WEGOVY 0.5 MG/0.5ML ~~LOC~~ SOAJ: 0.5000 mg | SUBCUTANEOUS | 0 refills | Status: DC

## 2023-09-04 NOTE — Telephone Encounter (Signed)
 Pt following up again needing to know what dosage she is supposed to take. Please advise Thank you

## 2023-09-05 NOTE — Telephone Encounter (Signed)
 Aware the 0.5 ws sent in and she takes it for 4 weeks before she needs to request the 1mg  from [place]. Verbalized understanding

## 2023-09-27 ENCOUNTER — Other Ambulatory Visit: Payer: Self-pay | Admitting: Family Medicine

## 2023-09-29 ENCOUNTER — Other Ambulatory Visit: Payer: Self-pay | Admitting: Family Medicine

## 2023-09-29 MED ORDER — WEGOVY 1 MG/0.5ML ~~LOC~~ SOAJ: 1.0000 mg | SUBCUTANEOUS | 0 refills | Status: DC

## 2023-10-25 ENCOUNTER — Other Ambulatory Visit: Payer: Self-pay | Admitting: Family Medicine

## 2023-11-23 ENCOUNTER — Ambulatory Visit: Payer: Medicaid Other | Admitting: Family Medicine

## 2024-02-02 ENCOUNTER — Ambulatory Visit (INDEPENDENT_AMBULATORY_CARE_PROVIDER_SITE_OTHER): Admitting: Family Medicine

## 2024-02-02 ENCOUNTER — Encounter: Payer: Self-pay | Admitting: Family Medicine

## 2024-02-02 DIAGNOSIS — Z6841 Body Mass Index (BMI) 40.0 and over, adult: Secondary | ICD-10-CM | POA: Diagnosis not present

## 2024-02-02 DIAGNOSIS — K219 Gastro-esophageal reflux disease without esophagitis: Secondary | ICD-10-CM

## 2024-02-02 DIAGNOSIS — Z23 Encounter for immunization: Secondary | ICD-10-CM

## 2024-02-02 DIAGNOSIS — R7301 Impaired fasting glucose: Secondary | ICD-10-CM | POA: Diagnosis not present

## 2024-02-02 DIAGNOSIS — E559 Vitamin D deficiency, unspecified: Secondary | ICD-10-CM

## 2024-02-02 DIAGNOSIS — E038 Other specified hypothyroidism: Secondary | ICD-10-CM

## 2024-02-02 DIAGNOSIS — E7849 Other hyperlipidemia: Secondary | ICD-10-CM

## 2024-02-02 NOTE — Patient Instructions (Addendum)
 I appreciate the opportunity to provide care to you today!    Follow up:  6 months  Labs: please stop by the lab today to get your blood drawn (CBC, CMP, TSH, Lipid profile, HgA1c, Vit D)  For a Healthier YOU, I Recommend: Reducing your intake of sugar, sodium, carbohydrates, and saturated fats. Increasing your fiber intake by incorporating more whole grains, fruits, and vegetables into your meals. Setting healthy goals with a focus on lowering your consumption of carbs, sugar, and unhealthy fats. Adding variety to your diet by including a wide range of fruits and vegetables. Cutting back on soda and limiting processed foods as much as possible. Staying active: In addition to taking your weight loss medication, aim for at least 150 minutes of moderate-intensity physical activity each week for optimal results.   Please follow up if your symptoms worsen or fail to improve.     Please continue to a heart-healthy diet and increase your physical activities. Try to exercise for at least five days a week.    It was a pleasure to see you and I look forward to continuing to work together on your health and well-being. Please do not hesitate to call the office if you need care or have questions about your care.  In case of emergency, please visit the Emergency Department for urgent care, or contact our clinic at [phone] to schedule an appointment. We're here to help you!   Have a wonderful day and week. With Gratitude, Maryclaire Stoecker MSN, FNP-BC

## 2024-02-02 NOTE — Assessment & Plan Note (Signed)
Emphasize Lifestyle Changes: A heart-healthy diet and increased physical activity are crucial. Healthy Tips for Weight Loss: Increase Intake of Nutrient-Rich Foods: Prioritize fruits, vegetables, and whole grains. Incorporate Lean Proteins: Include chicken, fish, beans, and legumes in your diet. Choose Low-Fat Dairy Products: Opt for dairy products that are low in fat. Reduce Unhealthy Fats: Limit saturated fats, trans fatty acids, and cholesterol. Aim for Regular Physical Activity: Engage in at least 30 minutes of brisk walking or other physical activities on at least 5 days a week.

## 2024-02-02 NOTE — Progress Notes (Signed)
 Established Patient Office Visit  Subjective:  Patient ID: Yvonne Cobb, female    DOB: 03/28/1971  Age: 53 y.o. MRN: [id number]  CC:  Chief Complaint  Patient presents with   Care Management    Four month follow up    HPI Yvonne Cobb is a 53 y.o. female with past medical history of  Asthma, GERD, Fibromyalgia presents for f/u of  chronic medical conditions.  For the details of today's visit, please refer to the assessment and plan.     Past Medical History:  Diagnosis Date   Abnormal stress test 04/10/2015   Asthma    Patient states asthmatic bronchitis   Chronic kidney disease    per patient, 75% right kidney function, 25% left kidney function   Depression    FH: CAD (coronary artery disease) 04/10/2015   Fibromyalgia    GERD (gastroesophageal reflux disease)     Past Surgical History:  Procedure Laterality Date   ABDOMINAL HYSTERECTOMY  2006   partial; left tube and ovary removal   CARDIAC CATHETERIZATION N/A 04/10/2015   Procedure: Left Heart Cath and Coronary Angiography;  Surgeon: Victory LELON Sharps, MD;  Location: [facility] INVASIVE CV LAB;  Service: Cardiovascular;  Laterality: N/A;   CESAREAN SECTION     ESOPHAGOGASTRODUODENOSCOPY     KIDNEY SURGERY  1990-2006   Patient states that she has had at least 10 surgeries on her left kidney   TONSILLECTOMY     TUBAL LIGATION  1997    Family History  Problem Relation Age of Onset   CAD Mother    Diabetes Mother    Hypertension Mother    Heart attack Mother    Lung cancer Father    Diabetes Father    Kidney disease Father    Diabetes Sister    Diabetes Sister     Social History   Socioeconomic History   Marital status: Divorced    Spouse name: Not on file   Number of children: Not on file   Years of education: Not on file   Highest education level: Some college, no degree  Occupational History   Not on file  Tobacco Use   Smoking status: Former    Types: Cigars    Quit date: 05/29/2013    Years  since quitting: 10.6   Smokeless tobacco: Never  Vaping Use   Vaping status: Never Used  Substance and Sexual Activity   Alcohol use: No   Drug use: No   Sexual activity: Not on file  Other Topics Concern   Not on file  Social History Narrative   Not on file   Social Drivers of Health   Financial Resource Strain: Low Risk  (01/30/2024)   Overall Financial Resource Strain (CARDIA)    Difficulty of Paying Living Expenses: Not very hard  Food Insecurity: Food Insecurity Present (01/30/2024)   Hunger Vital Sign    Worried About Running Out of Food in the Last Year: Sometimes true    Ran Out of Food in the Last Year: Sometimes true  Transportation Needs: No Transportation Needs (01/30/2024)   PRAPARE - Administrator, Civil Service (Medical): No    Lack of Transportation (Non-Medical): No  Physical Activity: Insufficiently Active (01/30/2024)   Exercise Vital Sign    Days of Exercise per Week: 2 days    Minutes of Exercise per Session: 30 min  Stress: No Stress Concern Present (01/30/2024)   [employer] of Occupational Health - Occupational Stress  Questionnaire    Feeling of Stress: Only a little  Social Connections: Moderately Integrated (01/30/2024)   Social Connection and Isolation Panel    Frequency of Communication with Friends and Family: Twice a week    Frequency of Social Gatherings with Friends and Family: Once a week    Attends Religious Services: More than 4 times per year    Active Member of [employer] or Organizations: Yes    Attends Engineer, structural: More than 4 times per year    Marital Status: Divorced  Recent Concern: Social Connections - Moderately Isolated (11/20/2023)   Received from [facility]   Social Connection and Isolation Panel    In a typical week, how many times do you talk on the phone with family, friends, or neighbors?: More than three times a week    How often do you get together with friends or relatives?: Once a week    How  often do you attend church or religious services?: More than 4 times per year    Do you belong to any clubs or organizations such as church groups, unions, fraternal or athletic groups, or school groups?: No    How often do you attend meetings of the clubs or organizations you belong to?: Never    Are you married, widowed, divorced, separated, never married, or living with a partner?: Divorced  Intimate Partner Violence: Not At Risk (11/20/2023)   Received from [facility]   Humiliation, Afraid, Rape, and Kick questionnaire    Within the last year, have you been afraid of your partner or ex-partner?: No    Within the last year, have you been humiliated or emotionally abused in other ways by your partner or ex-partner?: No    Within the last year, have you been kicked, hit, slapped, or otherwise physically hurt by your partner or ex-partner?: No    Within the last year, have you been raped or forced to have any kind of sexual activity by your partner or ex-partner?: No    Outpatient Medications Prior to Visit  Medication Sig Dispense Refill   albuterol  (VENTOLIN  HFA) 108 (90 Base) MCG/ACT inhaler Inhale 1-2 puffs into the lungs every 6 (six) hours as needed for wheezing or shortness of breath. 6.7 g 1   budesonide -formoterol  (SYMBICORT ) 160-4.5 MCG/ACT inhaler Inhale 2 puffs into the lungs 2 (two) times daily. 6 g 3   cyclobenzaprine (FLEXERIL) 5 MG tablet Take 5 mg by mouth 2 (two) times daily.     hydrOXYzine  (VISTARIL ) 25 MG capsule Take 1 capsule (25 mg total) by mouth at bedtime as needed. 30 capsule 0   meclizine  (ANTIVERT ) 25 MG tablet Take 1 tablet (25 mg total) by mouth 3 (three) times daily as needed for dizziness. 270 tablet 1   metoprolol  tartrate (LOPRESSOR ) 25 MG tablet Take 0.5 tablets (12.5 mg total) by mouth 2 (two) times daily. Hold if BP is less than 90 mm SBP and HR is less than 60 135 tablet 2   naproxen  (NAPROSYN ) 500 MG tablet Take 1 tablet (500 mg total) by mouth 2  (two) times daily with a meal. 60 tablet 1   pantoprazole  (PROTONIX ) 40 MG tablet Take 1 tablet (40 mg total) by mouth daily. 60 tablet 1   predniSONE  (DELTASONE ) 20 MG tablet Take 20 mg by mouth 2 (two) times daily. (Patient not taking: Reported on 07/24/2023)     promethazine -dextromethorphan (PROMETHAZINE -DM) 6.25-15 MG/5ML syrup Take 5 mLs by mouth 4 (four) times  daily as needed for cough. (Patient not taking: Reported on 07/24/2023)     Vitamin D , Ergocalciferol , (DRISDOL ) 1.25 MG (50000 UNIT) CAPS capsule Take 1 capsule (50,000 Units total) by mouth every 7 (seven) days. 20 capsule 1   WEGOVY  1 MG/0.5ML SOAJ INJECT 1 MG INTO THE SKIN ONCE A WEEK 4 mL 0   No facility-administered medications prior to visit.    Allergies  Allergen Reactions   Hydrocodone Itching    Hives/itching   Tape Itching    paper tape    ROS Review of Systems  Constitutional:  Negative for chills and fever.  Eyes:  Negative for visual disturbance.  Respiratory:  Negative for chest tightness and shortness of breath.   Neurological:  Negative for dizziness and headaches.      Objective:    Physical Exam HENT:     Head: Normocephalic.     Mouth/Throat:     Mouth: Mucous membranes are moist.  Cardiovascular:     Rate and Rhythm: Normal rate.     Heart sounds: Normal heart sounds.  Pulmonary:     Effort: Pulmonary effort is normal.     Breath sounds: Normal breath sounds.  Neurological:     Mental Status: She is alert.     BP 139/75   Pulse (!) 51   Ht 5' 6 (1.676 m)   Wt 262 lb (118.8 kg)   SpO2 98%   BMI 42.29 kg/m  Wt Readings from Last 3 Encounters:  02/02/24 262 lb (118.8 kg)  07/27/23 264 lb (119.7 kg)  07/24/23 264 lb 1.9 oz (119.8 kg)    Lab Results  Component Value Date   TSH 2.650 07/24/2023   Lab Results  Component Value Date   WBC 8.1 07/24/2023   HGB 12.7 07/24/2023   HCT 42.2 07/24/2023   MCV 77 (L) 07/24/2023   PLT 236 07/24/2023   Lab Results  Component  Value Date   NA 138 07/24/2023   K 4.7 07/24/2023   CO2 23 07/24/2023   GLUCOSE 71 07/24/2023   BUN 14 07/24/2023   CREATININE 1.05 (H) 07/24/2023   BILITOT 0.5 07/24/2023   ALKPHOS 97 07/24/2023   AST 12 07/24/2023   ALT 14 07/24/2023   PROT 7.1 07/24/2023   ALBUMIN 4.0 07/24/2023   CALCIUM 9.4 07/24/2023   ANIONGAP 9 10/28/2022   EGFR 65 07/24/2023   Lab Results  Component Value Date   CHOL 194 07/24/2023   Lab Results  Component Value Date   HDL 61 07/24/2023   Lab Results  Component Value Date   LDLCALC 123 (H) 07/24/2023   Lab Results  Component Value Date   TRIG 56 07/24/2023   Lab Results  Component Value Date   CHOLHDL 3.2 07/24/2023   Lab Results  Component Value Date   HGBA1C 5.8 (H) 07/24/2023      Assessment & Plan:  Morbid obesity (HCC) Assessment & Plan: Emphasize Lifestyle Changes: A heart-healthy diet and increased physical activity are crucial. Healthy Tips for Weight Loss: Increase Intake of Nutrient-Rich Foods: Prioritize fruits, vegetables, and whole grains. Incorporate Lean Proteins: Include chicken, fish, beans, and legumes in your diet. Choose Low-Fat Dairy Products: Opt for dairy products that are low in fat. Reduce Unhealthy Fats: Limit saturated fats, trans fatty acids, and cholesterol. Aim for Regular Physical Activity: Engage in at least 30 minutes of brisk walking or other physical activities on at least 5 days a week.     Gastroesophageal reflux disease  without esophagitis Assessment & Plan: Stable Encouraged adherence to a GERD-friendly diet.  .     IFG (impaired fasting glucose) -     Hemoglobin A1c  Vitamin D  deficiency -     VITAMIN D  25 Hydroxy (Vit-D Deficiency, Fractures)  TSH (thyroid -stimulating hormone deficiency) -     TSH + free T4  Other hyperlipidemia -     Lipid panel -     CMP14+EGFR -     CBC with Differential/Platelet  Note: This chart has been completed using Engineer, civil (consulting)  software, and while attempts have been made to ensure accuracy, certain words and phrases may not be transcribed as intended.    Follow-up: Return in about 6 months (around 08/01/2024).   Devanee Pomplun, FNP

## 2024-02-02 NOTE — Assessment & Plan Note (Signed)
 Stable Encouraged adherence to a GERD-friendly diet.  Yvonne Cobb

## 2024-02-03 LAB — CBC WITH DIFFERENTIAL/PLATELET
Basophils Absolute: 0 x10E3/uL (ref 0.0–0.2)
Basos: 0 %
EOS (ABSOLUTE): 0.3 x10E3/uL (ref 0.0–0.4)
Eos: 5 %
Hematocrit: 40.4 % (ref 34.0–46.6)
Hemoglobin: 11.9 g/dL (ref 11.1–15.9)
Immature Grans (Abs): 0 x10E3/uL (ref 0.0–0.1)
Immature Granulocytes: 0 %
Lymphocytes Absolute: 2 x10E3/uL (ref 0.7–3.1)
Lymphs: 34 %
MCH: 23 pg — ABNORMAL LOW (ref 26.6–33.0)
MCHC: 29.5 g/dL — ABNORMAL LOW (ref 31.5–35.7)
MCV: 78 fL — ABNORMAL LOW (ref 79–97)
Monocytes Absolute: 0.4 x10E3/uL (ref 0.1–0.9)
Monocytes: 7 %
Neutrophils Absolute: 3.3 x10E3/uL (ref 1.4–7.0)
Neutrophils: 54 %
Platelets: 206 x10E3/uL (ref 150–450)
RBC: 5.18 x10E6/uL (ref 3.77–5.28)
RDW: 15.3 % (ref 11.7–15.4)
WBC: 6 x10E3/uL (ref 3.4–10.8)

## 2024-02-03 LAB — CMP14+EGFR
ALT: 17 IU/L (ref 0–32)
AST: 15 IU/L (ref 0–40)
Albumin: 4 g/dL (ref 3.8–4.9)
Alkaline Phosphatase: 97 IU/L (ref 44–121)
BUN/Creatinine Ratio: 17 (ref 9–23)
BUN: 17 mg/dL (ref 6–24)
Bilirubin Total: 0.5 mg/dL (ref 0.0–1.2)
CO2: 20 mmol/L (ref 20–29)
Calcium: 9.3 mg/dL (ref 8.7–10.2)
Chloride: 104 mmol/L (ref 96–106)
Creatinine, Ser: 1 mg/dL (ref 0.57–1.00)
Globulin, Total: 2.6 g/dL (ref 1.5–4.5)
Glucose: 88 mg/dL (ref 70–99)
Potassium: 4.5 mmol/L (ref 3.5–5.2)
Sodium: 140 mmol/L (ref 134–144)
Total Protein: 6.6 g/dL (ref 6.0–8.5)
eGFR: 68 mL/min/1.73 (ref >59)

## 2024-02-03 LAB — TSH+FREE T4
Free T4: 1.27 ng/dL (ref 0.82–1.77)
TSH: 2.55 u[IU]/mL (ref 0.450–4.500)

## 2024-02-03 LAB — LIPID PANEL
Chol/HDL Ratio: 3.2 ratio (ref 0.0–4.4)
Cholesterol, Total: 165 mg/dL (ref 100–199)
HDL: 51 mg/dL (ref >39)
LDL Chol Calc (NIH): 104 mg/dL — ABNORMAL HIGH (ref 0–99)
Triglycerides: 51 mg/dL (ref 0–149)
VLDL Cholesterol Cal: 10 mg/dL (ref 5–40)

## 2024-02-03 LAB — HEMOGLOBIN A1C
Est. average glucose Bld gHb Est-mCnc: 117 mg/dL
Hgb A1c MFr Bld: 5.7 % — ABNORMAL HIGH (ref 4.8–5.6)

## 2024-02-03 LAB — VITAMIN D 25 HYDROXY (VIT D DEFICIENCY, FRACTURES): Vit D, 25-Hydroxy: 40.1 ng/mL (ref 30.0–100.0)

## 2024-02-05 ENCOUNTER — Ambulatory Visit: Payer: Self-pay | Admitting: Family Medicine

## 2024-03-06 ENCOUNTER — Telehealth: Payer: Self-pay | Admitting: Family Medicine

## 2024-03-06 NOTE — Telephone Encounter (Signed)
 Immunization report emailed to pt. If she does not get it, she can come pick it up.

## 2024-03-06 NOTE — Telephone Encounter (Signed)
 Copied from CRM #[id number]. Topic: General - Other >> Mar 06, 2024 11:11 AM Turkey B wrote: Reason for CRM: Patient called in asking for copy of flu shot  with her name on it to have for a job shie is getting, if possible please send to [email or web address] or patient can pick up if she has too

## 2024-04-01 ENCOUNTER — Encounter: Payer: Self-pay | Admitting: Radiology

## 2024-04-16 ENCOUNTER — Other Ambulatory Visit: Payer: Self-pay | Admitting: Anesthesiology

## 2024-04-16 DIAGNOSIS — M545 Low back pain, unspecified: Secondary | ICD-10-CM

## 2024-04-16 DIAGNOSIS — M542 Cervicalgia: Secondary | ICD-10-CM

## 2024-04-17 ENCOUNTER — Inpatient Hospital Stay: Admission: RE | Admit: 2024-04-17 | Discharge: 2024-04-17 | Attending: Anesthesiology | Admitting: Anesthesiology

## 2024-04-17 ENCOUNTER — Ambulatory Visit
Admission: RE | Admit: 2024-04-17 | Discharge: 2024-04-17 | Disposition: A | Discharge status: Home / Self Care | Source: Ambulatory Visit | Attending: Anesthesiology | Admitting: Anesthesiology

## 2024-04-17 DIAGNOSIS — M542 Cervicalgia: Secondary | ICD-10-CM

## 2024-04-17 DIAGNOSIS — M545 Low back pain, unspecified: Secondary | ICD-10-CM

## 2024-06-26 ENCOUNTER — Ambulatory Visit: Payer: MEDICAID | Admitting: Internal Medicine

## 2024-06-27 ENCOUNTER — Encounter: Payer: Self-pay | Admitting: Internal Medicine

## 2024-06-27 ENCOUNTER — Ambulatory Visit: Payer: Self-pay | Admitting: Internal Medicine

## 2024-06-27 ENCOUNTER — Encounter: Payer: Self-pay | Admitting: *Deleted

## 2024-06-27 NOTE — Progress Notes (Signed)
 Erroneous encounter - please disregard.

## 2024-07-03 ENCOUNTER — Ambulatory Visit: Admitting: Surgical

## 2024-07-03 ENCOUNTER — Encounter: Payer: Self-pay | Admitting: Surgical

## 2024-07-03 ENCOUNTER — Other Ambulatory Visit: Payer: Self-pay

## 2024-07-03 DIAGNOSIS — M25531 Pain in right wrist: Secondary | ICD-10-CM

## 2024-07-03 DIAGNOSIS — S52551A Other extraarticular fracture of lower end of right radius, initial encounter for closed fracture: Secondary | ICD-10-CM
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
## 2024-07-04 ENCOUNTER — Encounter (HOSPITAL_COMMUNITY): Payer: Self-pay | Admitting: Orthopedic Surgery

## 2024-07-04 ENCOUNTER — Other Ambulatory Visit: Payer: Self-pay

## 2024-07-04 NOTE — Progress Notes (Signed)
 SDW CALL  Patient was given pre-op instructions over the phone. The opportunity was given for the patient to ask questions. No further questions asked. Patient verbalized understanding of instructions given.   PCP - Gloria Zarwolo Cardiologist -  Vishnu Mallipeddi  PPM/ICD - denies Device Orders - n/a Rep Notified -  n/a  Chest x-ray - denies EKG - 06/22/24 Stress Test - 01/13/23 ECHO - 01/25/23 Cardiac Cath - 03/2015  Sleep Study - denies CPAP - n/a  Pre-diabetes but does not check blood sugar home  Last dose of GLP1 agonist-  n/a GLP1 instructions:  n/a  Blood Thinner Instructions: n/a Aspirin  Instructions: n/a  ERAS Protcol - clears until 1030 PRE-SURGERY Ensure or G2-  n/a  COVID TEST- no   Anesthesia review: yes - low heart rate   Patient denies shortness of breath, fever, cough and chest pain over the phone call   All instructions explained to the patient, with a verbal understanding of the material. Patient agrees to go over the instructions while at home for a better understanding.

## 2024-07-04 NOTE — Progress Notes (Signed)
 Anesthesia Chart Review:  54 yo female with pertinent hx including former smoker, asthma, GERD on PPI, renal insufficiency.   She underwent LHC in 2016 which showed angiographically normal coronaries. She had a false positive NM stress test.   Evaluated by cardiology in 2024 for sinus bradycardia and intermittent dizziness. Workup was benign. ETT 01/13/23 was normal. Echo showed LVEF 60-65%, grade 1dd, normal RV, no significant valvular abnormalities. Event monitor 12/2022 showed underlying rhythm to be sinus ranging from 46-146 with avg 76. 1 run of 6 beat NSVT. No atrial arrhythmias. No AV block or pauses. <1% PAC burden and 6.3% PVC burden. She was advised to start metoprolol  tartarate 12.5 mg twice daily, hold for BP less than 90 mm Hg SBP and HR less than 60 bpm.   She will need DOS labs and evaluation.  EKG 06/22/24: Sinus rhythm with sinus arrhythmia and occasional PVCs. Rate 78. Biatrial enlargement.   Event monitor 12/2022:   Patch wear time was 13 days and 23 hours.   Normal sinus rhythm predominantly ranging from 46 to 146 bpm with an average HR 76 bpm.   1 run of 6 beat NSVT. No atrial arrhythmias. No AV block or pauses.   <1% PAC burden and 6.3% PVC burden.   Patient triggered events correlated with NSR (64-85 bpm), VE, ventricular bigeminy, ventricular trigeminy.  Exercise tolerance test 01/13/23:   No ST deviation was noted.   Negative exercise stress test for ischemia. Duke treadmill score is 6 supporting low risk for major cardiac events   Peak heart rate 141 bpm, normal chronotropic response to exercise.  TTE 01/25/23:  1. Left ventricular ejection fraction, by estimation, is 60 to 65%. The  left ventricle has normal function. The left ventricle has no regional  wall motion abnormalities. Left ventricular diastolic parameters are  consistent with Grade I diastolic  dysfunction (impaired relaxation).   2. Right ventricular systolic function is normal. The right ventricular   size is normal. Tricuspid regurgitation signal is inadequate for assessing  PA pressure.   3. The mitral valve is normal in structure. No evidence of mitral valve  regurgitation. No evidence of mitral stenosis.   4. The aortic valve has an indeterminant number of cusps. Aortic valve  regurgitation is not visualized. No aortic stenosis is present.   5. The inferior vena cava is normal in size with greater than 50%  respiratory variability, suggesting right atrial pressure of 3 mmHg.      Lynwood Geofm RIGGERS [facility]/Anesthesiology Phone [phone] 07/04/2024 4:36 PM

## 2024-07-04 NOTE — Anesthesia Preprocedure Evaluation (Signed)
 "                                  Anesthesia Evaluation  Patient identified by MRN, date of birth, ID band Patient awake    Reviewed: Allergy & Precautions, NPO status , Patient's Chart, lab work & pertinent test results  History of Anesthesia Complications Negative for: history of anesthetic complications  Airway Mallampati: II  TM Distance: >3 FB Neck ROM: Full    Dental no notable dental hx. (+) Teeth Intact   Pulmonary asthma , neg sleep apnea, neg COPD, Patient abstained from smoking.Not current smoker, former smoker   Pulmonary exam normal breath sounds clear to auscultation       Cardiovascular Exercise Tolerance: Good METS(-) hypertension(-) CAD and (-) Past MI negative cardio ROS (-) dysrhythmias  Rhythm:Regular Rate:Normal - Systolic murmurs    Neuro/Psych  PSYCHIATRIC DISORDERS  Depression    negative neurological ROS     GI/Hepatic ,GERD  Medicated and Controlled,,(+)     (-) substance abuse    Endo/Other  neg diabetes    Renal/GU Renal disease     Musculoskeletal  (+)  Fibromyalgia -  Abdominal  (+) + obese  Peds  Hematology   Anesthesia Other Findings PAT note by Lynwood Hope, PA-C:  54 yo female with pertinent hx including former smoker, asthma, GERD on PPI, renal insufficiency.   She underwent LHC in 2016 which showed angiographically normal coronaries. She had a false positive NM stress test.   Evaluated by cardiology in 2024 for sinus bradycardia and intermittent dizziness. Workup was benign. ETT 01/13/23 was normal. Echo showed LVEF 60-65%, grade 1dd, normal RV, no significant valvular abnormalities. Event monitor 12/2022 showed underlying rhythm to be sinus ranging from 46-146 with avg 76. 1 run of 6 beat NSVT. No atrial arrhythmias. No AV block or pauses. <1% PAC burden and 6.3% PVC burden. She was advised to start metoprolol  tartarate 12.5 mg twice daily, hold for BP less than 90 mm Hg SBP and HR less than 60 bpm.   She will  need DOS labs and evaluation.  EKG 06/22/24: Sinus rhythm with sinus arrhythmia and occasional PVCs. Rate 78. Biatrial enlargement.   Event monitor 12/2022:   Patch wear time was 13 days and 23 hours.   Normal sinus rhythm predominantly ranging from 46 to 146 bpm with an average HR 76 bpm.   1 run of 6 beat NSVT. No atrial arrhythmias. No AV block or pauses.   <1% PAC burden and 6.3% PVC burden.   Patient triggered events correlated with NSR (64-85 bpm), VE, ventricular bigeminy, ventricular trigeminy.  Exercise tolerance test 01/13/23:   No ST deviation was noted.   Negative exercise stress test for ischemia. Duke treadmill score is 6 supporting low risk for major cardiac events   Peak heart rate 141 bpm, normal chronotropic response to exercise.  TTE 01/25/23: 1. Left ventricular ejection fraction, by estimation, is 60 to 65%. The  left ventricle has normal function. The left ventricle has no regional  wall motion abnormalities. Left ventricular diastolic parameters are  consistent with Grade I diastolic  dysfunction (impaired relaxation).  2. Right ventricular systolic function is normal. The right ventricular  size is normal. Tricuspid regurgitation signal is inadequate for assessing  PA pressure.  3. The mitral valve is normal in structure. No evidence of mitral valve  regurgitation. No evidence of mitral stenosis.  4. The  aortic valve has an indeterminant number of cusps. Aortic valve  regurgitation is not visualized. No aortic stenosis is present.  5. The inferior vena cava is normal in size with greater than 50%  respiratory variability, suggesting right atrial pressure of 3 mmHg.      Reproductive/Obstetrics                              Anesthesia Physical Anesthesia Plan  ASA: 2  Anesthesia Plan: Regional   Post-op Pain Management: Regional block*, Ofirmev  IV (intra-op)* and Toradol IV (intra-op)*   Induction:  Intravenous  PONV Risk Score and Plan: 2 and Propofol  infusion, TIVA, Ondansetron , Midazolam  and Dexamethasone  Airway Management Planned: Nasal Cannula and Natural Airway  Additional Equipment: None  Intra-op Plan:   Post-operative Plan:   Informed Consent: I have reviewed the patients History and Physical, chart, labs and discussed the procedure including the risks, benefits and alternatives for the proposed anesthesia with the patient or authorized representative who has indicated his/her understanding and acceptance.     Dental advisory given  Plan Discussed with: CRNA and Surgeon  Anesthesia Plan Comments: (Discussed risks of anesthesia with patient, including possibility of difficulty with spontaneous ventilation under anesthesia necessitating airway intervention, PONV, and rare risks such as cardiac or respiratory or neurological events, and allergic reactions. Discussed the role of CRNA in patient's perioperative care. Patient understands. Discussed r/b/a of supraclavicular nerve block, including:  - bleeding, infection, nerve damage - pneumothorax - shortness of breath from hemidiaphragmatic paralysis due to phrenic nerve blockade - poor or non functioning block. - reactions and toxicity to local anesthetic Patient understands. )         Anesthesia Quick Evaluation  "

## 2024-07-05 ENCOUNTER — Encounter (HOSPITAL_COMMUNITY): Payer: Self-pay | Admitting: Orthopedic Surgery

## 2024-07-05 ENCOUNTER — Ambulatory Visit (HOSPITAL_COMMUNITY)

## 2024-07-05 ENCOUNTER — Encounter (HOSPITAL_COMMUNITY): Admission: RE | Disposition: A | Payer: Self-pay | Source: Home / Self Care | Attending: Orthopedic Surgery

## 2024-07-05 ENCOUNTER — Other Ambulatory Visit: Payer: Self-pay

## 2024-07-05 ENCOUNTER — Encounter (HOSPITAL_COMMUNITY): Payer: Self-pay | Admitting: Physician Assistant

## 2024-07-05 ENCOUNTER — Ambulatory Visit (HOSPITAL_COMMUNITY)
Admission: RE | Admit: 2024-07-05 | Discharge: 2024-07-05 | Disposition: A | Payer: Self-pay | Source: Home / Self Care | Attending: Orthopedic Surgery | Admitting: Orthopedic Surgery

## 2024-07-05 DIAGNOSIS — Z01818 Encounter for other preprocedural examination: Secondary | ICD-10-CM

## 2024-07-05 HISTORY — DX: Iron deficiency: E61.1

## 2024-07-05 HISTORY — DX: Bursopathy, unspecified: M71.9

## 2024-07-05 HISTORY — DX: Anemia, unspecified: D64.9

## 2024-07-05 HISTORY — DX: Prediabetes: R73.03

## 2024-07-05 MED ORDER — TRANEXAMIC ACID-NACL 1000-0.7 MG/100ML-% IV SOLN
INTRAVENOUS | Status: AC
  Filled 2024-07-05: qty 100

## 2024-07-05 MED ORDER — MIDAZOLAM HCL (PF) 2 MG/2ML IJ SOLN
INTRAMUSCULAR | Status: DC | PRN
  Administered 2024-07-05: 2 mg via INTRAVENOUS

## 2024-07-05 MED ORDER — VASOPRESSIN 20 UNIT/ML IV SOLN
INTRAVENOUS | Status: AC
  Filled 2024-07-05: qty 1

## 2024-07-05 MED ORDER — METHOCARBAMOL 500 MG PO TABS: 500.0000 mg | ORAL_TABLET | Freq: Three times a day (TID) | ORAL | 1 refills | Status: AC | PRN

## 2024-07-05 MED ORDER — TRANEXAMIC ACID-NACL 1000-0.7 MG/100ML-% IV SOLN
1000.0000 mg | INTRAVENOUS | Status: AC
  Administered 2024-07-05: 1000 mg via INTRAVENOUS
  Filled 2024-07-05: qty 100

## 2024-07-05 MED ORDER — LACTATED RINGERS IV SOLN: INTRAVENOUS | Status: DC

## 2024-07-05 MED ORDER — LACTATED RINGERS IV SOLN: INTRAVENOUS | Status: DC | PRN

## 2024-07-05 MED ORDER — OXYCODONE HCL 5 MG PO TABS: 5.0000 mg | ORAL_TABLET | ORAL | 0 refills | Status: AC | PRN

## 2024-07-05 MED ORDER — ONDANSETRON HCL 4 MG/2ML IJ SOLN
INTRAMUSCULAR | Status: DC | PRN
  Administered 2024-07-05: 4 mg via INTRAVENOUS

## 2024-07-05 MED ORDER — POVIDONE-IODINE 10 % EX SWAB
2.0000 | Freq: Once | CUTANEOUS | Status: AC
  Administered 2024-07-05: 2 via TOPICAL

## 2024-07-05 MED ORDER — FENTANYL CITRATE (PF) 100 MCG/2ML IJ SOLN
INTRAMUSCULAR | Status: AC
  Filled 2024-07-05: qty 2

## 2024-07-05 MED ORDER — MIDAZOLAM HCL 2 MG/2ML IJ SOLN
INTRAMUSCULAR | Status: AC
  Filled 2024-07-05: qty 2

## 2024-07-05 MED ORDER — PROPOFOL 500 MG/50ML IV EMUL
INTRAVENOUS | Status: DC | PRN
  Administered 2024-07-05: 100 ug/kg/min via INTRAVENOUS

## 2024-07-05 MED ORDER — OXYCODONE HCL 5 MG/5ML PO SOLN: 5.0000 mg | Freq: Once | ORAL | Status: DC | PRN

## 2024-07-05 MED ORDER — MIDAZOLAM HCL 2 MG/2ML IJ SOLN
INTRAMUSCULAR | Status: AC
  Administered 2024-07-05: 1 mg via INTRAVENOUS
  Filled 2024-07-05: qty 2

## 2024-07-05 MED ORDER — OXYCODONE HCL 5 MG PO TABS: 5.0000 mg | ORAL_TABLET | Freq: Once | ORAL | Status: DC | PRN

## 2024-07-05 MED ORDER — 0.9 % SODIUM CHLORIDE (POUR BTL) OPTIME
TOPICAL | Status: DC | PRN
  Administered 2024-07-05: 1000 mL

## 2024-07-05 MED ORDER — VANCOMYCIN HCL 1000 MG IV SOLR
INTRAVENOUS | Status: AC
  Filled 2024-07-05: qty 20

## 2024-07-05 MED ORDER — DROPERIDOL 2.5 MG/ML IJ SOLN: 0.6250 mg | Freq: Once | INTRAMUSCULAR | Status: DC | PRN

## 2024-07-05 MED ORDER — PROPOFOL 10 MG/ML IV BOLUS
INTRAVENOUS | Status: AC
  Filled 2024-07-05: qty 20

## 2024-07-05 MED ORDER — ACETAMINOPHEN 10 MG/ML IV SOLN: 1000.0000 mg | Freq: Once | INTRAVENOUS | Status: DC | PRN

## 2024-07-05 MED ORDER — FENTANYL CITRATE (PF) 100 MCG/2ML IJ SOLN: 25.0000 ug | INTRAMUSCULAR | Status: DC | PRN

## 2024-07-05 MED ORDER — ORAL CARE MOUTH RINSE: 15.0000 mL | Freq: Once | OROMUCOSAL | Status: AC

## 2024-07-05 MED ORDER — MIDAZOLAM HCL (PF) 2 MG/2ML IJ SOLN: 1.0000 mg | Freq: Once | INTRAMUSCULAR | Status: AC

## 2024-07-05 MED ORDER — POVIDONE-IODINE 7.5 % EX SOLN
Freq: Once | CUTANEOUS | Status: DC
  Filled 2024-07-05: qty 118

## 2024-07-05 MED ORDER — BUPIVACAINE-EPINEPHRINE (PF) 0.5% -1:200000 IJ SOLN
INTRAMUSCULAR | Status: DC | PRN
  Administered 2024-07-05: 20 mL via PERINEURAL

## 2024-07-05 MED ORDER — CEFAZOLIN SODIUM-DEXTROSE 2-4 GM/100ML-% IV SOLN
2.0000 g | INTRAVENOUS | Status: AC
  Administered 2024-07-05: 2 g via INTRAVENOUS
  Filled 2024-07-05: qty 100

## 2024-07-05 MED ORDER — PHENYLEPHRINE HCL-NACL 20-0.9 MG/250ML-% IV SOLN
INTRAVENOUS | Status: DC | PRN
  Administered 2024-07-05: 35 ug/min via INTRAVENOUS

## 2024-07-05 MED ORDER — CELECOXIB 100 MG PO CAPS: 100.0000 mg | ORAL_CAPSULE | Freq: Every day | ORAL | 0 refills | Status: AC

## 2024-07-05 MED ORDER — CHLORHEXIDINE GLUCONATE 0.12 % MT SOLN
15.0000 mL | Freq: Once | OROMUCOSAL | Status: AC
  Administered 2024-07-05: 15 mL via OROMUCOSAL
  Filled 2024-07-05: qty 15

## 2024-07-05 NOTE — Brief Op Note (Signed)
" ° °  07/05/2024  4:35 PM  PATIENT:  Yvonne Cobb  54 y.o. female  PRE-OPERATIVE DIAGNOSIS:  right distal radius fracture  POST-OPERATIVE DIAGNOSIS:  right distal radius fracture  PROCEDURE:  Procedures: OPEN REDUCTION INTERNAL FIXATION (ORIF) DISTAL RADIUS FRACTURE  SURGEON:  Surgeon(s): Addie Cordella Hamilton, MD  ASSISTANT: magnant pa  ANESTHESIA:   general  EBL: 10 ml    Total I/O In: 900 [I.V.:700; IV Piggyback:200] Out: -   BLOOD ADMINISTERED: none  DRAINS: none   LOCAL MEDICATIONS USED:  none  SPECIMEN:  No Specimen  COUNTS:  YES  TOURNIQUET:   Total Tourniquet Time Documented: Upper Arm (Right) - 46 minutes Total: Upper Arm (Right) - 46 minutes   DICTATION: .Other Dictation: Dictation Number [id number]  PLAN OF CARE: Discharge to home after PACU  PATIENT DISPOSITION:  PACU - hemodynamically stable              "

## 2024-07-05 NOTE — Anesthesia Procedure Notes (Signed)
 Anesthesia Regional Block: Supraclavicular block   Pre-Anesthetic Checklist: , timeout performed,  Correct Patient, Correct Site, Correct Laterality,  Correct Procedure, Correct Position, site marked,  Risks and benefits discussed,  Surgical consent,  Pre-op evaluation,  At surgeon's request and post-op pain management  Laterality: Right  Prep: chloraprep       Needles:  Injection technique: Single-shot  Needle Type: Echogenic Needle     Needle Length: 4cm  Needle Gauge: 25     Additional Needles:   Procedures:,,,, ultrasound used (permanent image in chart),,    Narrative:  Start time: 07/05/2024 1:20 PM End time: 07/05/2024 1:23 PM Injection made incrementally with aspirations every 5 mL.  Performed by: Personally  Anesthesiologist: Boone Fess, MD  Additional Notes: Patient's chart reviewed and they were deemed appropriate candidate for procedure, per surgeon's request. Patient educated about risks, benefits, and alternatives of the block including but not limited to: temporary or permanent nerve damage, hemidiaphragmatic paralysis leading to dyspnea, pneumothorax, bleeding, infection, damage to surround tissues, block failure, local anesthetic toxicity. Patient expressed understanding. A formal time-out was conducted consistent with institution rules.  Monitors were applied, and minimal sedation used (see nursing record). The site was prepped with skin prep and allowed to dry, and sterile gloves were used. A high frequency linear ultrasound probe with probe cover was utilized throughout. Supraclavicular artery visualized, along with the brachial plexus adjacent to it and appeared anatomically normal. Local anesthetic injected around the plexus, and echogenic block needle trajectory was monitored throughout. Aspiration performed every 5ml. Lung and blood vessels were avoided. All injections were performed without resistance and free of blood and paresthesias. The patient tolerated the  procedure well.  Injectate: 20ml 0.5% bupivacaine  with 1:200,000 epinephrine 

## 2024-07-05 NOTE — Transfer of Care (Signed)
 Immediate Anesthesia Transfer of Care Note  Patient: Yvonne Cobb  Procedure(s) Performed: OPEN REDUCTION INTERNAL FIXATION (ORIF) DISTAL RADIUS FRACTURE (Right)  Patient Location: PACU  Anesthesia Type:MAC and Regional  Level of Consciousness: drowsy and patient cooperative  Airway & Oxygen Therapy: Patient Spontanous Breathing and Patient connected to face mask oxygen  Post-op Assessment: Report given to RN, Post -op Vital signs reviewed and stable, Patient moving all extremities, and Patient moving all extremities X 4  Post vital signs: Reviewed and stable  Last Vitals:  Vitals Value Taken Time  BP 121/63 07/05/24 16:33  Temp    Pulse 70 07/05/24 16:35  Resp 17 07/05/24 16:35  SpO2 100 % 07/05/24 16:35  Vitals shown include unfiled device data.  Last Pain:  Vitals:   07/05/24 1120  TempSrc:   PainSc: 0-No pain      Patients Stated Pain Goal: 0 (07/05/24 1120)  Complications: No notable events documented.

## 2024-07-05 NOTE — H&P (Signed)
 Yvonne Cobb is an 54 y.o. female.   Chief Complaint: right wrist pain  HPI: Yvonne Cobb is a 54 y.o. female who presents  reporting right wrist pain.  Patient states that she sustained a fall on the ice on 06/26/2024.  She was seen at the [facility] emergency department and diagnosed with distal radius fracture.  She has never had a previous fracture or prior surgery to her wrist.  She denies any significant medical history aside from asthma and bradycardia that she sees a cardiologist for.  She does not take any blood thinners or have defibrillator.  Does not smoke.  No history of diabetes.  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  In her free time, she enjoys spending time with her family and her new grandchild.  She does not do any physically intensive hobbies.  She is right-hand dominant   Past Medical History:  Diagnosis Date   Abnormal stress test 04/10/2015   Anemia    Asthma    Patient states asthmatic bronchitis   Bursitis    Chronic kidney disease    per patient, 75% right kidney function, 25% left kidney function   Depression    FH: CAD (coronary artery disease) 04/10/2015   Fibromyalgia    GERD (gastroesophageal reflux disease)    Iron deficiency    Pre-diabetes     Past Surgical History:  Procedure Laterality Date   ABDOMINAL HYSTERECTOMY  2006   partial; left tube and ovary removal   CARDIAC CATHETERIZATION N/A 04/10/2015   Procedure: Left Heart Cath and Coronary Angiography;  Surgeon: Victory LELON Sharps, MD;  Location: [facility] INVASIVE CV LAB;  Service: Cardiovascular;  Laterality: N/A;   CESAREAN SECTION     ESOPHAGOGASTRODUODENOSCOPY     KIDNEY SURGERY  1990-2006   Patient states that she has had at least 10 surgeries on her left kidney   TONSILLECTOMY     TUBAL LIGATION  1997    Family History  Problem Relation Age of Onset   CAD Mother    Diabetes Mother     Hypertension Mother    Heart attack Mother    Lung cancer Father    Diabetes Father    Kidney disease Father    Diabetes Sister    Diabetes Sister    Social History:  reports that she quit smoking about 11 years ago. Her smoking use included cigars. She has never used smokeless tobacco. She reports that she does not drink alcohol and does not use drugs.  Allergies: Allergies[1]  Medications Prior to Admission  Medication Sig Dispense Refill   albuterol  (VENTOLIN  HFA) 108 (90 Base) MCG/ACT inhaler Inhale 1-2 puffs into the lungs every 6 (six) hours as needed for wheezing or shortness of breath. 6.7 g 1   diphenhydrAMINE (BENADRYL) 25 MG tablet Take 25 mg by mouth every 6 (six) hours as needed for itching (Take with oxy).     esomeprazole (NEXIUM) 20 MG capsule Take 20 mg by mouth daily at 12 noon.     metoprolol  tartrate (LOPRESSOR ) 25 MG tablet Take 0.5 tablets (12.5 mg total) by mouth 2 (two) times daily. Hold if BP is less than 90 mm SBP and HR is less than 60 135 tablet 2   oxyCODONE -acetaminophen  (PERCOCET/ROXICET) 5-325 MG tablet Take 1 tablet by mouth every 4 (four) hours as needed for moderate pain (pain score 4-6) or severe  pain (pain score 7-10).     hydrOXYzine  (VISTARIL ) 25 MG capsule Take 1 capsule (25 mg total) by mouth at bedtime as needed. (Patient taking differently: Take 25 mg by mouth at bedtime as needed (Sleep).) 30 capsule 0   meclizine  (ANTIVERT ) 25 MG tablet Take 1 tablet (25 mg total) by mouth 3 (three) times daily as needed for dizziness. 270 tablet 1   Vitamin D , Ergocalciferol , (DRISDOL ) 1.25 MG (50000 UNIT) CAPS capsule Take 1 capsule (50,000 Units total) by mouth every 7 (seven) days. (Patient not taking: Reported on 07/04/2024) 20 capsule 1    No results found for this or any previous visit (from the past 48 hours). No results found.  Review of Systems  Musculoskeletal:  Positive for arthralgias.  All other systems reviewed and are negative.   Blood  pressure 117/75, pulse 69, temperature 98.3 F (36.8 C), temperature source Oral, resp. rate 14, height 5' 6 (1.676 m), weight 108.9 kg, SpO2 100%. Physical Exam Vitals reviewed.  HENT:     Head: Normocephalic.     Nose: Nose normal.     Mouth/Throat:     Mouth: Mucous membranes are moist.  Eyes:     Pupils: Pupils are equal, round, and reactive to light.  Cardiovascular:     Rate and Rhythm: Normal rate.     Pulses: Normal pulses.  Pulmonary:     Effort: Pulmonary effort is normal.  Abdominal:     General: Abdomen is flat.  Musculoskeletal:     Cervical back: Normal range of motion.  Skin:    General: Skin is warm.     Capillary Refill: Capillary refill takes less than 2 seconds.  Neurological:     General: No focal deficit present.     Mental Status: She is alert.  Psychiatric:        Mood and Affect: Mood normal.     Ortho exam demonstrates intact EPL, FPL, finger abduction.  Tenderness diffusely throughout the right wrist with mild ecchymosis and mild to moderate swelling.  No skin injury concerning for open fracture noted.  2+ radial pulse of the right upper extremity.  Has very limited mobility of the wrist and forearm secondary to pain.   Assessment/Plan  Impression is 54 year old female with right distal radius fracture.  Radiographs reviewed from time of injury and compared with today's radiographs which demonstrate small degree of shortening of about 3 mm but there is change in the volar tilt angle on the lateral view and this is  significant compared with the contralateral extremity.  The volar tilt is now more of a dorsal tilt which would likely contribute to decreased wrist flexion , grip strength, and altered wrist biomechanics that will interfere with her ability to do her physical work of cleaning.  This is her dominant hand as well.  Plan is to proceed with distal radius fracture open reduction internal fixation.  Discussed risks and benefits with patient as well as  recovery timeframe.  All questions answered.  Follow-up after procedure.   KANDICE Glendia Hutchinson, MD 07/05/2024, 2:17 PM       [1]  Allergies Allergen Reactions   Hydrocodone Itching    Hives/itching   Tape Itching    Can use paper tape

## 2024-07-17 ENCOUNTER — Encounter: Admitting: Orthopedic Surgery

## 2024-08-02 ENCOUNTER — Ambulatory Visit: Admitting: Family Medicine
